# Patient Record
Sex: Male | Born: 1960 | Race: Black or African American | Hispanic: No | Marital: Single | Smoking: Current every day smoker
Health system: Southern US, Community
[De-identification: ages and names within clinical notes are randomized; demographics above are authoritative.]

## PROBLEM LIST (undated history)

## (undated) DIAGNOSIS — J449 Chronic obstructive pulmonary disease, unspecified: Secondary | ICD-10-CM

## (undated) DIAGNOSIS — R918 Other nonspecific abnormal finding of lung field: Secondary | ICD-10-CM

## (undated) DIAGNOSIS — R0600 Dyspnea, unspecified: Secondary | ICD-10-CM

## (undated) DIAGNOSIS — I10 Essential (primary) hypertension: Secondary | ICD-10-CM

## (undated) HISTORY — PX: KNEE SURGERY: SHX244

## (undated) HISTORY — DX: Other nonspecific abnormal finding of lung field: R91.8

---

## 2006-12-09 ENCOUNTER — Emergency Department: Payer: Self-pay | Admitting: Emergency Medicine

## 2018-03-19 ENCOUNTER — Other Ambulatory Visit: Payer: Self-pay

## 2018-03-19 ENCOUNTER — Emergency Department
Admission: EM | Admit: 2018-03-19 | Discharge: 2018-03-19 | Disposition: A | Discharge status: Home / Self Care | Payer: Self-pay | Attending: Emergency Medicine | Admitting: Emergency Medicine

## 2018-03-19 DIAGNOSIS — F17200 Nicotine dependence, unspecified, uncomplicated: Secondary | ICD-10-CM | POA: Insufficient documentation

## 2018-03-19 DIAGNOSIS — J020 Streptococcal pharyngitis: Secondary | ICD-10-CM | POA: Insufficient documentation

## 2018-03-19 LAB — GROUP A STREP BY PCR: Group A Strep by PCR: DETECTED — AB

## 2018-03-19 MED ORDER — ACETAMINOPHEN 325 MG PO TABS
650.0000 mg | ORAL_TABLET | Freq: Once | ORAL | Status: AC | PRN
  Administered 2018-03-19: 650 mg via ORAL

## 2018-03-19 MED ORDER — ACETAMINOPHEN 325 MG PO TABS
ORAL_TABLET | ORAL | Status: AC
  Filled 2018-03-19: qty 2

## 2018-03-19 MED ORDER — AMOXICILLIN-POT CLAVULANATE 875-125 MG PO TABS
1.0000 | ORAL_TABLET | Freq: Once | ORAL | Status: AC
  Administered 2018-03-19: 1 via ORAL
  Filled 2018-03-19: qty 1

## 2018-03-19 MED ORDER — PREDNISONE 20 MG PO TABS
60.0000 mg | ORAL_TABLET | Freq: Once | ORAL | Status: AC
  Administered 2018-03-19: 60 mg via ORAL
  Filled 2018-03-19: qty 3

## 2018-03-19 MED ORDER — ACETAMINOPHEN 500 MG PO TABS
1000.0000 mg | ORAL_TABLET | Freq: Once | ORAL | Status: AC
  Administered 2018-03-19: 1000 mg via ORAL
  Filled 2018-03-19: qty 2

## 2018-03-19 MED ORDER — AMOXICILLIN-POT CLAVULANATE 875-125 MG PO TABS: 1.0000 | ORAL_TABLET | Freq: Two times a day (BID) | ORAL | 0 refills | Status: AC

## 2018-03-19 MED ORDER — PREDNISONE 10 MG PO TABS: 10.0000 mg | ORAL_TABLET | Freq: Every day | ORAL | 0 refills | Status: DC

## 2018-03-19 NOTE — ED Notes (Signed)
See triage note  Presents with fever and sore throat  States sx's started couple of days ago  Febrile on arrival

## 2018-03-19 NOTE — ED Provider Notes (Signed)
[facility] EMERGENCY DEPARTMENT Provider Note   CSN: [id number] Arrival date & time: 03/19/18  1129    History   Chief Complaint Chief Complaint  Patient presents with  . Sore Throat    HPI Connor Kline is a 58 y.o. male.  Presents to the emergency department for evaluation of 2 days of sore throat.  He denies any cough congestion or runny nose.  He states he has had some chills and a fever.  He has not take any medications for his symptoms.  He is tolerating p.o. well.  No difficulty swallowing.  No known contacts with strep.     HPI  History reviewed. No pertinent past medical history.  There are no active problems to display for this patient.   Past Surgical History:  Procedure Laterality Date  . KNEE SURGERY          Home Medications    Prior to Admission medications   Medication Sig Start Date End Date Taking? Authorizing Provider  amoxicillin-clavulanate (AUGMENTIN) 875-125 MG tablet Take 1 tablet by mouth every 12 (twelve) hours for 10 days. 03/19/18 03/29/18  Duanne Guess, PA-C  predniSONE (DELTASONE) 10 MG tablet Take 1 tablet (10 mg total) by mouth daily. 6,5,4,3,2,1 six day taper 03/19/18   Renata Caprice    Family History History reviewed. No pertinent family history.  Social History Social History   Tobacco Use  . Smoking status: Current Every Day Smoker  Substance Use Topics  . Alcohol use: Yes  . Drug use: Not on file     Allergies   Patient has no known allergies.   Review of Systems Review of Systems  Constitutional: Positive for fever.  HENT: Positive for sore throat. Negative for congestion, rhinorrhea, sinus pressure and sinus pain.   Respiratory: Negative for cough, wheezing and stridor.   Gastrointestinal: Negative for diarrhea, nausea and vomiting.  Musculoskeletal: Positive for myalgias. Negative for arthralgias and neck stiffness.  Skin: Negative for rash.  Neurological: Negative for  dizziness.     Physical Exam Updated Vital Signs BP 133/89   Pulse 76   Temp (!) 101.6 F (38.7 C) (Oral)   Resp 18   Ht 5\' 11"  (1.803 m)   Wt 81.6 kg   SpO2 97%   BMI 25.10 kg/m   Physical Exam Constitutional:      General: He is not in acute distress.    Appearance: He is well-developed.  HENT:     Head: Normocephalic and atraumatic.     Jaw: No trismus.     Right Ear: Hearing and external ear normal.     Left Ear: Hearing and external ear normal.     Nose: Rhinorrhea present.     Right Sinus: No maxillary sinus tenderness or frontal sinus tenderness.     Left Sinus: No maxillary sinus tenderness or frontal sinus tenderness.     Mouth/Throat:     Pharynx: Uvula midline. Oropharyngeal exudate and posterior oropharyngeal erythema present. No pharyngeal swelling or uvula swelling.     Tonsils: Tonsillar exudate present. No tonsillar abscesses. Swelling: 1+ on the right. 1+ on the left.  Eyes:     Conjunctiva/sclera: Conjunctivae normal.  Neck:     Musculoskeletal: Normal range of motion.  Cardiovascular:     Rate and Rhythm: Normal rate and regular rhythm.  Pulmonary:     Effort: No respiratory distress.     Breath sounds: No stridor. No wheezing.  Chest:  Chest wall: No tenderness.  Abdominal:     General: There is no distension.     Palpations: Abdomen is soft.     Tenderness: There is no abdominal tenderness. There is no guarding.  Musculoskeletal: Normal range of motion.  Skin:    General: Skin is warm and dry.     Findings: No rash.  Neurological:     Mental Status: He is alert and oriented to person, place, and time.  Psychiatric:        Behavior: Behavior normal.        Thought Content: Thought content normal.        Judgment: Judgment normal.      ED Treatments / Results  Labs (all labs ordered are listed, but only abnormal results are displayed) Labs Reviewed  GROUP A STREP BY PCR - Abnormal; Notable for the following components:       Result Value   Group A Strep by PCR DETECTED (*)    All other components within normal limits    EKG None  Radiology No results found.  Procedures Procedures (including critical care time)  Medications Ordered in ED Medications  acetaminophen (TYLENOL) tablet 650 mg (650 mg Oral Given 03/19/18 1150)  amoxicillin-clavulanate (AUGMENTIN) 875-125 MG per tablet 1 tablet (1 tablet Oral Given 03/19/18 1240)  acetaminophen (TYLENOL) tablet 1,000 mg (1,000 mg Oral Given 03/19/18 1240)  predniSONE (DELTASONE) tablet 60 mg (60 mg Oral Given 03/19/18 1257)     Initial Impression / Assessment and Plan / ED Course  I have reviewed the triage vital signs and the nursing notes.  Pertinent labs & imaging results that were available during my care of the patient were reviewed by me and considered in my medical decision making (see chart for details).        *58 year old male with exudative pharyngitis with no signs of peritonsillar abscess.  He tolerates p.o. well.  He is given Tylenol for fever.  He was able to tolerate p.o. antibiotics.  He is discharged home with prednisone, Augmentin.  He will alternate Tylenol and ibuprofen as needed.  He understands signs symptoms return to ED for.  Final Clinical Impressions(s) / ED Diagnoses   Final diagnoses:  Strep pharyngitis    ED Discharge Orders         Ordered    amoxicillin-clavulanate (AUGMENTIN) 875-125 MG tablet  Every 12 hours     03/19/18 1339    predniSONE (DELTASONE) 10 MG tablet  Daily     03/19/18 1339           Renata Caprice 03/19/18 1342    Lavonia Drafts, MD 03/19/18 1421

## 2018-03-19 NOTE — ED Triage Notes (Signed)
C/o sore throat that began last night. No facial swelling noted. A&O, ambulatory.

## 2018-03-19 NOTE — Discharge Instructions (Addendum)
Please make sure you are drinking lots of fluids.  Alternate Tylenol and ibuprofen as needed for pain or fevers.  Return to the ER immediately for any difficulty swallowing, fevers above 102, worsening symptoms or urgent changes in your health.

## 2020-03-24 ENCOUNTER — Emergency Department
Admission: EM | Admit: 2020-03-24 | Discharge: 2020-03-24 | Disposition: A | Discharge status: Home / Self Care | Payer: Self-pay | Attending: Emergency Medicine | Admitting: Emergency Medicine

## 2020-03-24 ENCOUNTER — Emergency Department: Payer: Self-pay

## 2020-03-24 ENCOUNTER — Encounter: Payer: Self-pay | Admitting: Radiology

## 2020-03-24 ENCOUNTER — Other Ambulatory Visit: Payer: Self-pay

## 2020-03-24 DIAGNOSIS — R200 Anesthesia of skin: Secondary | ICD-10-CM | POA: Insufficient documentation

## 2020-03-24 DIAGNOSIS — Z20822 Contact with and (suspected) exposure to covid-19: Secondary | ICD-10-CM | POA: Insufficient documentation

## 2020-03-24 DIAGNOSIS — R918 Other nonspecific abnormal finding of lung field: Secondary | ICD-10-CM

## 2020-03-24 DIAGNOSIS — I10 Essential (primary) hypertension: Secondary | ICD-10-CM

## 2020-03-24 DIAGNOSIS — I200 Unstable angina: Secondary | ICD-10-CM

## 2020-03-24 DIAGNOSIS — F17200 Nicotine dependence, unspecified, uncomplicated: Secondary | ICD-10-CM | POA: Insufficient documentation

## 2020-03-24 DIAGNOSIS — F1290 Cannabis use, unspecified, uncomplicated: Secondary | ICD-10-CM

## 2020-03-24 DIAGNOSIS — R079 Chest pain, unspecified: Secondary | ICD-10-CM

## 2020-03-24 DIAGNOSIS — F1490 Cocaine use, unspecified, uncomplicated: Secondary | ICD-10-CM

## 2020-03-24 DIAGNOSIS — Z720 Tobacco use: Secondary | ICD-10-CM

## 2020-03-24 LAB — CBC
HCT: 46.1 % (ref 39.0–52.0)
Hemoglobin: 15.2 g/dL (ref 13.0–17.0)
MCH: 27.9 pg (ref 26.0–34.0)
MCHC: 33 g/dL (ref 30.0–36.0)
MCV: 84.6 fL (ref 80.0–100.0)
Platelets: 282 10*3/uL (ref 150–400)
RBC: 5.45 MIL/uL (ref 4.22–5.81)
RDW: 13.8 % (ref 11.5–15.5)
WBC: 8.8 10*3/uL (ref 4.0–10.5)
nRBC: 0 % (ref 0.0–0.2)

## 2020-03-24 LAB — BASIC METABOLIC PANEL
Anion gap: 7 (ref 5–15)
BUN: 15 mg/dL (ref 6–20)
CO2: 23 mmol/L (ref 22–32)
Calcium: 9.7 mg/dL (ref 8.9–10.3)
Chloride: 106 mmol/L (ref 98–111)
Creatinine, Ser: 0.86 mg/dL (ref 0.61–1.24)
GFR, Estimated: >60 mL/min (ref >60)
Glucose, Bld: 100 mg/dL — ABNORMAL HIGH (ref 70–99)
Potassium: 3.9 mmol/L (ref 3.5–5.1)
Sodium: 136 mmol/L (ref 135–145)

## 2020-03-24 LAB — RESP PANEL BY RT-PCR (FLU A&B, COVID) ARPGX2
Influenza A by PCR: NEGATIVE
Influenza B by PCR: NEGATIVE
SARS Coronavirus 2 by RT PCR: NEGATIVE

## 2020-03-24 LAB — TROPONIN I (HIGH SENSITIVITY)
Troponin I (High Sensitivity): 5 ng/L (ref <18)
Troponin I (High Sensitivity): 5 ng/L (ref <18)

## 2020-03-24 MED ORDER — IOHEXOL 350 MG/ML SOLN
100.0000 mL | Freq: Once | INTRAVENOUS | Status: AC | PRN
  Administered 2020-03-24: 100 mL via INTRAVENOUS

## 2020-03-24 NOTE — ED Notes (Signed)
ED Provider at bedside. 

## 2020-03-24 NOTE — ED Provider Notes (Signed)
Emergency department signout note  Care of this patient was signed out to me at the end of the previous value shift.  All pertinent patient information was conveyed and all questions were answered.  Patient was pending a CTA of the chest to evaluate for pulmonary nodule seen on chest x-ray.  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  Given patient's history of tobacco abuse, this is highly likely.  Patient was explained these results and the need for likely MRIs to evaluate for spinal metastasis.  However, patient requested to have these imaging studies done in the outpatient setting as he wishes to go home and get some sleep today.  Patient's vital signs stable.  Patient is not hypoxic.  Patient has no pain or focal neurologic symptoms at this time.  Patient will be given referral to the oncology department in order to follow-up for these studies.  The patient has been reexamined and is ready to be discharged.  All diagnostic results have been reviewed and discussed with the patient/family.  Care plan has been outlined and the patient/family understands all current diagnoses, results, and treatment plans.  There are no new complaints, changes, or physical findings at this time.  All questions have been addressed and answered.  Patient was instructed to, and agrees to follow-up with their primary care physician as well as return to the emergency department if any new or worsening symptoms develop.   Naaman Plummer, MD 03/24/20 1005

## 2020-03-24 NOTE — ED Notes (Signed)
Dr. Cheri Fowler at bedside for reevaluation.

## 2020-03-24 NOTE — ED Triage Notes (Addendum)
Pt states that he started to have left arm numbness yesterday when he reached for a cigarette and today he started to have numbness to both legs and arms along with chest pain after eating. Pt states the numbness comes and goes, and says right now it is getting better.

## 2020-03-24 NOTE — ED Notes (Signed)
Pt returned from CT at this time.  

## 2020-03-24 NOTE — ED Notes (Signed)
ER provider made aware of pt.

## 2020-03-24 NOTE — ED Provider Notes (Signed)
[facility] Emergency Department Provider Note  ____________________________________________   Event Date/Time   First MD Initiated Contact with Patient 03/24/20 [phone]     (approximate)  I have reviewed the triage vital signs and the nursing notes.   HISTORY  Chief Complaint Chest Pain and Numbness    HPI Connor Kline is a 60 y.o. male with no known medical problems although he never goes to the doctor.  He presents for evaluation of chest pain.  He said that a couple days ago he started to have left arm numbness all of a sudden when he reached for a cigarette offered by her friend.  It went away within a few minutes.  However today he started to have central sharp chest pain with heaviness that radiates through to his back and the numbness.  It seemed to happen after eating.  Nothing in particular made it better or worse.  It is better now and does not seem to be related to exertion.  He has no weakness in his extremities.  He is unaware of having high blood pressure and does not take medicines.  He admits to tobacco use and marijuana use.  He says he smokes cocaine sometimes but has not done so for at least a week.  The symptoms were moderate in severity and are now gone but they come back sometimes.         History reviewed. No pertinent past medical history.  There are no problems to display for this patient.   Past Surgical History:  Procedure Laterality Date  . KNEE SURGERY      Prior to Admission medications   Medication Sig Start Date End Date Taking? Authorizing Provider  predniSONE (DELTASONE) 10 MG tablet Take 1 tablet (10 mg total) by mouth daily. 6,5,4,3,2,1 six day taper 03/19/18   Duanne Guess, PA-C    Allergies Patient has no known allergies.  No family history on file.  Social History Social History   Tobacco Use  . Smoking status: Current Every Day Smoker  Substance Use Topics  . Alcohol use: Yes    Review of  Systems Constitutional: No fever/chills Eyes: No visual changes. ENT: No sore throat. Cardiovascular: Chest pain as described above. Respiratory: Denies shortness of breath. Gastrointestinal: No abdominal pain.  No nausea, no vomiting.  No diarrhea.  No constipation. Genitourinary: Negative for dysuria. Musculoskeletal: Negative for neck pain.  Negative for back pain. Integumentary: Negative for rash. Neurological: Some numbness in his arms, particularly the left arm.  Occasional numbness in the back of his legs but that has resolved.  No focal weakness.   ____________________________________________   PHYSICAL EXAM:  VITAL SIGNS: ED Triage Vitals  Enc Vitals Group     BP 03/24/20 0236 (!) 174/119     Pulse Rate 03/24/20 0236 76     Resp 03/24/20 0236 20     Temp 03/24/20 0236 98.6 F (37 C)     Temp src --      SpO2 03/24/20 0236 98 %     Weight 03/24/20 0238 81.6 kg (180 lb)     Height 03/24/20 0238 1.803 m (5\' 11" )     Head Circumference --      Peak Flow --      Pain Score 03/24/20 0238 3     Pain Loc --      Pain Edu? --      Excl. in [place]? --     Constitutional: Alert and  oriented.  Eyes: Conjunctivae are normal.  Head: Atraumatic. Nose: No congestion/rhinnorhea. Mouth/Throat: Patient is wearing a mask. Neck: No stridor.  No meningeal signs.   Cardiovascular: Normal rate, regular rhythm. Good peripheral circulation. Respiratory: Normal respiratory effort.  No retractions. Gastrointestinal: Soft and nontender. No distention.  Musculoskeletal: No lower extremity tenderness nor edema. No gross deformities of extremities. Neurologic:  Normal speech and language. No gross focal neurologic deficits are appreciated.  Skin:  Skin is warm, dry and intact. Psychiatric: Mood and affect are normal. Speech and behavior are normal.  ____________________________________________   LABS (all labs ordered are listed, but only abnormal results are displayed)  Labs Reviewed   BASIC METABOLIC PANEL - Abnormal; Notable for the following components:      Result Value   Glucose, Bld 100 (*)    All other components within normal limits  RESP PANEL BY RT-PCR (FLU A&B, COVID) ARPGX2  CBC  TROPONIN I (HIGH SENSITIVITY)  TROPONIN I (HIGH SENSITIVITY)   ____________________________________________  EKG  ED ECG REPORT I, Hinda Kehr, the attending physician, personally viewed and interpreted this ECG.  Date: 03/24/2020 EKG Time: 2:23 AM Rate: 74 Rhythm: normal sinus rhythm QRS Axis: normal Intervals: normal ST/T Wave abnormalities: There is some mild ST segment elevation in leads V2 and V3 which is suspected chronic with no reciprocal changes. Narrative Interpretation: no definitive evidence of acute ischemia  ____________________________________________  RADIOLOGY I, Hinda Kehr, personally viewed and evaluated these images (plain radiographs) as part of my medical decision making, as well as reviewing the written report by the radiologist.  ED MD interpretation: Chest x-ray shows bilateral pulmonary nodules largest 3 cm in the left upper lobe.  CT chest/abdomen/pelvis pending at the time of transfer of care to Dr. Cheri Fowler.  Official radiology report(s): DG Chest 2 View  Result Date: 03/24/2020 CLINICAL DATA:  Chest pain EXAM: CHEST - 2 VIEW COMPARISON:  None. FINDINGS: The heart size and mediastinal contours are within normal limits. There is a 3 cm nodular opacity seen within the left upper lung. Within the right lung there is a 7 mm nodular opacity within the right upper lobe. No pleural effusion or pneumothorax is noted. No acute osseous abnormality. IMPRESSION: Bilateral pulmonary nodules, the largest measuring 3 cm within the left upper lobe. Would recommend dedicated chest CT with contrast for further evaluation. Electronically Signed   By: Prudencio Pair M.D.   On: 03/24/2020 03:06     ____________________________________________   PROCEDURES   Procedure(s) performed (including Critical Care):  .1-3 Lead EKG Interpretation Performed by: Hinda Kehr, MD Authorized by: Hinda Kehr, MD     Interpretation: normal     ECG rate:  70   ECG rate assessment: normal     Rhythm: sinus rhythm     Ectopy: none     Conduction: normal       ____________________________________________   INITIAL IMPRESSION / MDM / ASSESSMENT AND PLAN / ED COURSE  As part of my medical decision making, I reviewed the following data within the [facility] notes reviewed and incorporated, Labs reviewed , EKG interpreted , Old chart reviewed, Radiograph reviewed  and Notes from prior ED visits   Differential diagnosis includes, but is not limited to, ACS, AAS, uncontrolled hypertension, musculoskeletal pain, angina.  The patient is on the cardiac monitor to evaluate for evidence of arrhythmia and/or significant heart rate changes.  The patient does not typically have chest pain and has multiple risk factors including a family  member (father) who had a heart attack, the patient's own tobacco and cocaine use, and most likely diagnoses that at least include hypertension even though he has not been to the doctor recently.  Technically this suggests unstable angina given the nature of his symptoms and the fact that it is occurring at rest when he does not normally have pain.  His EKG does not meet criteria for STEMI but could represent acute ischemia even though it most likely represents more of a chronic issue.  He is not currently having chest pain but his blood pressure is quite elevated.  Lab work is notable for a normal CBC, and initial high-sensitivity troponin of 5 with a second 1 pending, and a basic metabolic panel that is normal.  He has no abdominal pain or tenderness.  The patient has a pending chest x-ray.     Clinical Course as of 03/24/20 0818  Mon  Mar 24, 2020  0635 I personally reviewed the patient's imaging and agree with the radiologist's interpretation that the patient has multiple large pulmonary nodules on chest x-ray.  The radiologist specifically recommended that I obtain a CT chest with IV contrast.  However I am concerned about the fact that the patient is having chest pain with numbness in his upper extremities associated with very high blood pressure.  In an attempt to evaluate for AAS as well as pulmonary nodules, will obtain a CTA chest/abdomen/pelvis dissection protocol in the hopes that this will also adequately imaged the lung parenchyma.  I believe that the patient is high enough risk for ACS that if his CTA is negative for AAS, he should be started on heparin and given a full dose aspirin and admitted for further management of his blood pressure and chest pain. [CF]  [id number] Transferred ED care to Dr. Cheri Fowler to follow-up on CTA and to disposition the patient appropriately. [CF]    Clinical Course User Index [CF] Hinda Kehr, MD     ____________________________________________  FINAL CLINICAL IMPRESSION(S) / ED DIAGNOSES  Final diagnoses:  Unstable angina (HCC)  Chest pain, unspecified type  Uncontrolled hypertension  Tobacco use  Cocaine use  Marijuana use     MEDICATIONS GIVEN DURING THIS VISIT:  Medications  iohexol (OMNIPAQUE) 350 MG/ML injection 100 mL (100 mLs Intravenous Contrast Given 03/24/20 0656)     ED Discharge Orders    None      *Please note:  DOC MANDALA was evaluated in Emergency Department on 03/24/2020 for the symptoms described in the history of present illness. He was evaluated in the context of the global COVID-19 pandemic, which necessitated consideration that the patient might be at risk for infection with the SARS-CoV-2 virus that causes COVID-19. Institutional protocols and algorithms that pertain to the evaluation of patients at risk for COVID-19 are in a state of rapid  change based on information released by regulatory bodies including the CDC and federal and state organizations. These policies and algorithms were followed during the patient's care in the ED.  Some ED evaluations and interventions may be delayed as a result of limited staffing during and after the pandemic.*  Note:  This document was prepared using Dragon voice recognition software and may include unintentional dictation errors.   Hinda Kehr, MD 03/24/20 [phone]

## 2020-03-24 NOTE — ED Notes (Signed)
Pt presents via triage for c/o chest pain since last night. Pt reports that 2 days ago his L arm went numb and then got better. Pt sts then after eating a chicken sandwich he had chest pain and the backs of his arms and legs went numb. Pt sts he has slight numbness still. Chest pain resolved without intervention. Pt also reports SOB and nausea with chest pain.

## 2020-03-31 ENCOUNTER — Encounter: Payer: Self-pay | Admitting: Hematology and Oncology

## 2020-03-31 NOTE — [place] Notes (Signed)
[facility]  [address] [place], [place] [address] Phone: [phone]  Fax: [phone]   Clinic Day:  04/01/2020  Referring physician: Naaman Plummer, MD  Chief Complaint: Connor Kline is a 60 y.o. male with a RUL lung mass who is referred in consultation by Dr. Valora Piccolo for assessment and management.   HPI: The patient presented to the [facility] ER on 03/24/2020 for evaluation of chest pain. A couple of days prior, he had sudden onset arm and leg numbness which went away within a few minutes. He then started to have shortness of breath and central sharp chest pain/heaviness that radiated to his back. The pain occurred after he ate a sandwich. Pain resolved by the time he went to the ER.  CXR revealed bilateral pulmonary nodules, the largest measuring 3 cm within the left upper lobe.   Chest, abdomen, and pelvis CT angiogram revealed severe emphysema with a 2.7 cm right upper lobe lung nodule/mass most c/w bronchogenic carcinoma.  There was no lymphadenopathy or distant metastatic disease. Recommended referral to [facility]. There was aortic atherosclerosis and tortuosity. There was no aortic aneurysm or dissection. There was iliofemoral atherosclerosis. Major arterial structures remained patent. No other acute or inflammatory process was identified in the chest, abdomen, or pelvis.  He does not have a PCP. He does not have any other medical problems. He had surgery on his right knee. He does not take any medications. He has never had a colonoscopy.  Symptomatically, he reports sweats, shortness of breath, and weight loss (unknown amount). He has lower back pain.  He denies fevers, headaches, changes in vision, runny nose, sore throat, cough, chest pain, palpitations, nausea, vomiting, diarrhea, reflux, urinary symptoms, skin changes, numbness, weakness, balance or coordination problem, and bleeding of any kind.  His  mother has "cancer in her blood". She is still living. He denies a family history of blood disorders.  He denies any family history of malignancy.   History reviewed. No pertinent past medical history.  Past Surgical History:  Procedure Laterality Date  . KNEE SURGERY      History reviewed. No pertinent family history.  Social History:  reports that he has been smoking. He has never used smokeless tobacco. He reports current alcohol use. No history on file for drug use. The patient smokes a pack of cigarettes per day. He has smoked a pack per day since he was in his teens. He used to drink a lot of alcohol. Now, he rarely drinks. He smokes marijuana. He has used cocaine. He denies exposure to radiation or toxins. His sister-in-law is Patent examiner. He lives in [place]. 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 The patient is accompanied by Anne Ng today.  Allergies: No Known Allergies  Current Medications: Current Outpatient Medications  Medication Sig Dispense Refill  . predniSONE (DELTASONE) 10 MG tablet Take 1 tablet (10 mg total) by mouth daily. 6,5,4,3,2,1 six day taper (Patient not taking: No sig reported) 21 tablet 0   No current facility-administered medications for this visit.    Review of Systems  Constitutional: Positive for diaphoresis and weight loss. Negative for chills, fever and malaise/fatigue.  HENT: Negative for congestion, ear discharge, ear pain, hearing loss, nosebleeds, sinus pain, sore throat and tinnitus.   Eyes: Negative for blurred vision and double vision.  Respiratory: Positive for shortness of breath. Negative for cough, hemoptysis and sputum production.   Cardiovascular:  Negative for chest pain, palpitations and leg swelling.  Gastrointestinal: Negative for abdominal pain, blood in stool, constipation, diarrhea, heartburn, melena, nausea and vomiting.  Genitourinary: Negative for dysuria, frequency, hematuria and urgency.   Musculoskeletal: Positive for back pain (lumbar spine). Negative for joint pain, myalgias and neck pain.  Skin: Negative for itching and rash.  Neurological: Negative for dizziness, tingling, sensory change, weakness and headaches.  Endo/Heme/Allergies: Does not bruise/bleed easily.  Psychiatric/Behavioral: Negative for depression and memory loss. The patient is not nervous/anxious and does not have insomnia.   All other systems reviewed and are negative.  Performance status (ECOG): 1  Vitals Blood pressure (!) 151/104, pulse 71, temperature (!) 96.6 F (35.9 C), temperature source Tympanic, weight 200 lb 1.1 oz (90.8 kg), SpO2 97 %.   Physical Exam Vitals and nursing note reviewed.  Constitutional:      General: He is not in acute distress.    Appearance: He is not diaphoretic.  HENT:     Head: Normocephalic and atraumatic.     Mouth/Throat:     Mouth: Mucous membranes are moist.     Pharynx: Oropharynx is clear.  Eyes:     General: No scleral icterus.    Extraocular Movements: Extraocular movements intact.     Conjunctiva/sclera: Conjunctivae normal.     Pupils: Pupils are equal, round, and reactive to light.  Cardiovascular:     Rate and Rhythm: Normal rate and regular rhythm.     Heart sounds: Normal heart sounds. No murmur heard.   Pulmonary:     Effort: Pulmonary effort is normal. No respiratory distress.     Breath sounds: Normal breath sounds. No wheezing or rales.  Chest:     Chest wall: No tenderness.  Breasts:     Right: No axillary adenopathy or supraclavicular adenopathy.     Left: No axillary adenopathy or supraclavicular adenopathy.    Abdominal:     General: Bowel sounds are normal. There is no distension.     Palpations: Abdomen is soft. There is no mass.     Tenderness: There is no abdominal tenderness. There is no guarding or rebound.  Musculoskeletal:        General: No swelling or tenderness. Normal range of motion.     Cervical back: Normal  range of motion and neck supple.  Lymphadenopathy:     Head:     Right side of head: No preauricular, posterior auricular or occipital adenopathy.     Left side of head: No preauricular, posterior auricular or occipital adenopathy.     Cervical: No cervical adenopathy.     Upper Body:     Right upper body: No supraclavicular or axillary adenopathy.     Left upper body: No supraclavicular or axillary adenopathy.     Lower Body: No right inguinal adenopathy. No left inguinal adenopathy.  Skin:    General: Skin is warm and dry.  Neurological:     Mental Status: He is alert and oriented to person, place, and time.  Psychiatric:        Behavior: Behavior normal.        Thought Content: Thought content normal.        Judgment: Judgment normal.    No visits with results within 3 Day(s) from this visit.  Latest known visit with results is:  Admission on 03/24/2020, Discharged on 03/24/2020  Component Date Value Ref Range Status  . Sodium 03/24/2020 136  135 - 145 mmol/L Final  . Potassium 03/24/2020  3.9  3.5 - 5.1 mmol/L Final  . Chloride 03/24/2020 106  98 - 111 mmol/L Final  . CO2 03/24/2020 23  22 - 32 mmol/L Final  . Glucose, Bld 03/24/2020 100* 70 - 99 mg/dL Final   Glucose reference range applies only to samples taken after fasting for at least 8 hours.  . BUN 03/24/2020 15  6 - 20 mg/dL Final  . Creatinine, Ser 03/24/2020 0.86  0.61 - 1.24 mg/dL Final  . Calcium 03/24/2020 9.7  8.9 - 10.3 mg/dL Final  . GFR, Estimated 03/24/2020 >60  >60 mL/min Final   Comment: (NOTE) Calculated using the CKD-EPI Creatinine Equation (2021)   . Anion gap 03/24/2020 7  5 - 15 Final   Performed at [facility], [address] [place], [place] [address]  . WBC 03/24/2020 8.8  4.0 - 10.5 K/uL Final  . RBC 03/24/2020 5.45  4.22 - 5.81 MIL/uL Final  . Hemoglobin 03/24/2020 15.2  13.0 - 17.0 g/dL Final  . HCT 03/24/2020 46.1  39.0 - 52.0 % Final  . MCV 03/24/2020 84.6  80.0 - 100.0 fL  Final  . MCH 03/24/2020 27.9  26.0 - 34.0 pg Final  . MCHC 03/24/2020 33.0  30.0 - 36.0 g/dL Final  . RDW 03/24/2020 13.8  11.5 - 15.5 % Final  . Platelets 03/24/2020 282  150 - 400 K/uL Final  . nRBC 03/24/2020 0.0  0.0 - 0.2 % Final   Performed at [facility], [address] [place], [place] [address]  . Troponin I (High Sensitivity) 03/24/2020 5  <18 ng/L Final   Comment: (NOTE) Elevated high sensitivity troponin I (hsTnI) values and significant  changes across serial measurements may suggest ACS but many other  chronic and acute conditions are known to elevate hsTnI results.  Refer to the "Links" section for chest pain algorithms and additional  guidance. Performed at [facility], [address] [place], [place] [address]   . Troponin I (High Sensitivity) 03/24/2020 5  <18 ng/L Final   Comment: (NOTE) Elevated high sensitivity troponin I (hsTnI) values and significant  changes across serial measurements may suggest ACS but many other  chronic and acute conditions are known to elevate hsTnI results.  Refer to the "Links" section for chest pain algorithms and additional  guidance. Performed at [facility], [address], [place], [place] [address]   . SARS Coronavirus 2 by RT PCR 03/24/2020 NEGATIVE  NEGATIVE Final   Comment: (NOTE) SARS-CoV-2 target nucleic acids are NOT DETECTED.  The SARS-CoV-2 RNA is generally detectable in upper respiratory specimens during the acute phase of infection. The lowest concentration of SARS-CoV-2 viral copies this assay can detect is 138 copies/mL. A negative result does not preclude SARS-Cov-2 infection and should not be used as the sole basis for treatment or other patient management decisions. A negative result may occur with  improper specimen collection/handling, submission of specimen other than nasopharyngeal swab, presence of viral mutation(s) within the areas targeted by this assay, and inadequate  number of viral copies(<138 copies/mL). A negative result must be combined with clinical observations, patient history, and epidemiological information. The expected result is Negative.  Fact Sheet for Patients:  EntrepreneurPulse.com.au  Fact Sheet for Healthcare Providers:  IncredibleEmployment.be  This test is no                          t yet approved or cleared by the [place] FDA  and  has been authorized for detection and/or diagnosis of SARS-CoV-2 by FDA under an Emergency Use Authorization (EUA). This EUA will remain  in effect (meaning this test can be used) for the duration of the COVID-19 declaration under Section 564(b)(1) of the Act, 21 U.S.C.section 360bbb-3(b)(1), unless the authorization is terminated  or revoked sooner.      . Influenza A by PCR 03/24/2020 NEGATIVE  NEGATIVE Final  . Influenza B by PCR 03/24/2020 NEGATIVE  NEGATIVE Final   Comment: (NOTE) The Xpert Xpress SARS-CoV-2/FLU/RSV plus assay is intended as an aid in the diagnosis of influenza from Nasopharyngeal swab specimens and should not be used as a sole basis for treatment. Nasal washings and aspirates are unacceptable for Xpert Xpress SARS-CoV-2/FLU/RSV testing.  Fact Sheet for Patients: EntrepreneurPulse.com.au  Fact Sheet for Healthcare Providers: IncredibleEmployment.be  This test is not yet approved or cleared by the [place] FDA and has been authorized for detection and/or diagnosis of SARS-CoV-2 by FDA under an Emergency Use Authorization (EUA). This EUA will remain in effect (meaning this test can be used) for the duration of the COVID-19 declaration under Section 564(b)(1) of the Act, 21 U.S.C. section 360bbb-3(b)(1), unless the authorization is terminated or revoked.  Performed at [facility], [address] [place], [place] [address]     Assessment:  Connor Kline is a 60 y.o.  male with a right upper lobe mass.  Chest, abdomen, and pelvis CT angiogram revealed severe emphysema with a 2.7 cm right upper lobe lung nodule/mass most c/w bronchogenic carcinoma.  There was no lymphadenopathy or distant metastatic disease. Recommended referral to [facility]. There was aortic atherosclerosis and tortuosity. There was no aortic aneurysm or dissection. There was iliofemoral atherosclerosis. Major arterial structures remained patent. No other acute or inflammatory process was identified in the chest, abdomen, or pelvis.  Symptomatically, hereports sweats, shortness of breath, and weight loss (unknown amount). He has lower back pain.  Exam is unremarkable.  Plan: 1.   Labs today:  CBC with diff, CMP.  2.   Right upper lobe lung mass  Review CT angiogram with patient.  He appears to have clinical T1cN0M0 right upper lobe lung cancer.  Discuss plan for PET scan to ensure no evidence of metastatic disease.  Discuss staging and treatment of lung cancer.   If he has stage I disease, he may be a candidate for resection.    Obtain PFTs.   If he has evidence of mediastinal involvement or metastatic disease, discuss biopsy.    Discuss indications for chemotherapy and/or radiation.  Discuss the plan for head imaging (head MRI).  Multiple questions asked and answered.   2.   Transportation issues  Patient interested in [place] services. 3.   PET scan on 04/08/2020. 4.   Schedule PFTs. 5.   Present at tumor board on 04/10/2020. 6.   RTC after above for MD assessment and discussion regarding direction of therapy.  I discussed the assessment and treatment plan with the patient.  The patient was provided an opportunity to ask questions and all were answered.  The patient agreed with the plan and demonstrated an understanding of the instructions.  The patient was advised to call back if the symptoms worsen or if the condition fails to improve as  anticipated.  I provided 28 minutes of face-to-face time during this this encounter and > 50% was spent counseling as documented under my assessment and plan.  An additional 15 minutes were spent  reviewing his chart (Epic and Care Everywhere) including notes, labs, and imaging studies.    Tyrese Capriotti C. Mike Gip, MD, PhD    04/01/2020, 3:08 PM  I, Mirian Mo Tufford, am acting as Education administrator for [employer]. Mike Gip, MD, PhD.  I, Moya Duan C. Mike Gip, MD, have reviewed the above documentation for accuracy and completeness, and I agree with the above.

## 2020-04-01 ENCOUNTER — Encounter: Payer: Self-pay | Admitting: Hematology and Oncology

## 2020-04-01 ENCOUNTER — Inpatient Hospital Stay: Payer: Self-pay | Attending: Hematology and Oncology | Admitting: Hematology and Oncology

## 2020-04-01 ENCOUNTER — Other Ambulatory Visit: Payer: Self-pay

## 2020-04-01 ENCOUNTER — Inpatient Hospital Stay: Payer: Self-pay

## 2020-04-01 VITALS — BP 151/104 | HR 71 | Temp 96.6°F | Wt 200.1 lb

## 2020-04-01 DIAGNOSIS — R918 Other nonspecific abnormal finding of lung field: Secondary | ICD-10-CM

## 2020-04-01 DIAGNOSIS — C3411 Malignant neoplasm of upper lobe, right bronchus or lung: Secondary | ICD-10-CM | POA: Insufficient documentation

## 2020-04-01 LAB — CBC WITH DIFFERENTIAL/PLATELET
Abs Immature Granulocytes: 0.04 10*3/uL (ref 0.00–0.07)
Basophils Absolute: 0.1 10*3/uL (ref 0.0–0.1)
Basophils Relative: 1 %
Eosinophils Absolute: 0.2 10*3/uL (ref 0.0–0.5)
Eosinophils Relative: 2 %
HCT: 45.8 % (ref 39.0–52.0)
Hemoglobin: 14.4 g/dL (ref 13.0–17.0)
Immature Granulocytes: 1 %
Lymphocytes Relative: 31 %
Lymphs Abs: 2.7 10*3/uL (ref 0.7–4.0)
MCH: 27 pg (ref 26.0–34.0)
MCHC: 31.4 g/dL (ref 30.0–36.0)
MCV: 85.9 fL (ref 80.0–100.0)
Monocytes Absolute: 0.5 10*3/uL (ref 0.1–1.0)
Monocytes Relative: 5 %
Neutro Abs: 5.4 10*3/uL (ref 1.7–7.7)
Neutrophils Relative %: 60 %
Platelets: 293 10*3/uL (ref 150–400)
RBC: 5.33 MIL/uL (ref 4.22–5.81)
RDW: 14 % (ref 11.5–15.5)
WBC: 8.8 10*3/uL (ref 4.0–10.5)
nRBC: 0 % (ref 0.0–0.2)

## 2020-04-01 LAB — COMPREHENSIVE METABOLIC PANEL
ALT: 19 U/L (ref 0–44)
AST: 20 U/L (ref 15–41)
Albumin: 4 g/dL (ref 3.5–5.0)
Alkaline Phosphatase: 34 U/L — ABNORMAL LOW (ref 38–126)
Anion gap: 11 (ref 5–15)
BUN: 17 mg/dL (ref 6–20)
CO2: 26 mmol/L (ref 22–32)
Calcium: 9.7 mg/dL (ref 8.9–10.3)
Chloride: 104 mmol/L (ref 98–111)
Creatinine, Ser: 0.98 mg/dL (ref 0.61–1.24)
GFR, Estimated: >60 mL/min (ref >60)
Glucose, Bld: 100 mg/dL — ABNORMAL HIGH (ref 70–99)
Potassium: 4.6 mmol/L (ref 3.5–5.1)
Sodium: 141 mmol/L (ref 135–145)
Total Bilirubin: 0.5 mg/dL (ref 0.3–1.2)
Total Protein: 8.1 g/dL (ref 6.5–8.1)

## 2020-04-10 ENCOUNTER — Other Ambulatory Visit: Payer: Self-pay

## 2020-04-10 NOTE — [place] Notes (Signed)
Tumor Board Documentation  Connor Kline was presented by Dr Mike Gip at our Tumor Board on 04/10/2020, which included representatives from medical oncology,radiation oncology,surgical,radiology,pathology,navigation,research,palliative care,genetics,pharmacy,pulmonology.  Connor Kline currently presents as a current patient,for discussion with history of the following treatments: active survellience.  Additionally, we reviewed previous medical and familial history, history of present illness, and recent lab results along with all available histopathologic and imaging studies. The tumor board considered available treatment options and made the following recommendations: Additional screening,Surgery,Biopsy (PET Scan, PFT's)    The following procedures/referrals were also placed: No orders of the defined types were placed in this encounter.   Clinical Trial Status: not discussed   Staging used: To be determined  AJCC Staging:       Group: Lung Mass RUL   National site-specific guidelines   were discussed with respect to the case.  Tumor board is a meeting of clinicians from various specialty areas who evaluate and discuss patients for whom a multidisciplinary approach is being considered. Final determinations in the plan of care are those of the provider(s). The responsibility for follow up of recommendations given during tumor board is that of the provider.   Today's extended care, comprehensive team conference, Connor Kline was not present for the discussion and was not examined.   Multidisciplinary Tumor Board is a multidisciplinary case peer review process.  Decisions discussed in the Multidisciplinary Tumor Board reflect the opinions of the specialists present at the conference without having examined the patient.  Ultimately, treatment and diagnostic decisions rest with the primary provider(s) and the patient.

## 2020-04-15 ENCOUNTER — Other Ambulatory Visit: Payer: Self-pay | Admitting: Hematology and Oncology

## 2020-04-15 ENCOUNTER — Other Ambulatory Visit: Payer: Self-pay

## 2020-04-15 ENCOUNTER — Encounter
Admission: RE | Admit: 2020-04-15 | Discharge: 2020-04-15 | Disposition: A | Discharge status: Home / Self Care | Payer: Self-pay | Source: Ambulatory Visit | Attending: Hematology and Oncology | Admitting: Hematology and Oncology

## 2020-04-15 DIAGNOSIS — R918 Other nonspecific abnormal finding of lung field: Secondary | ICD-10-CM

## 2020-04-15 LAB — GLUCOSE, CAPILLARY: Glucose-Capillary: 90 mg/dL (ref 70–99)

## 2020-04-15 MED ORDER — FLUDEOXYGLUCOSE F - 18 (FDG) INJECTION
10.4000 | Freq: Once | INTRAVENOUS | Status: AC | PRN
  Administered 2020-04-15: 10.5 via INTRAVENOUS

## 2020-04-15 NOTE — [place] Notes (Signed)
[facility]  [address], [address] [place], [place] [address] Phone: [phone]  Fax: [phone]   Clinic Day:  04/16/2020  Referring physician: Naaman Plummer, MD  Chief Complaint: Connor Kline is a 60 y.o. male with a RUL lung mass who is seen for 2 week assessment and discussion regarding direction of therapy.  HPI: The patient was last seen in the medical oncology clinic on 04/01/2020 for new patient assessment. At that time, he deccribed sweats, shortness of breath, and weight loss (unknown amount). He had lower back pain.  Exam was unremarkable. Hematocrit was 45.8, hemoglobin 14.4, platelets 293,000, WBC 8,800 (ANC 5400).  CMP was normal.  Tumor Board on 04/10/2020 recommended additional screening, surgery, biopsy (PET Scan, PFTs).  PET scan on 04/15/2020 revealed a 2.6 cm hypermetabolic left upper lobe lung mass c/w primary lung neoplasm. There were surrounding interstitial changes which could reflect obstructive pneumonitis or interstitial spread of tumor. There were no enlarged or hypermetabolic mediastinal or hilar nodes to suggest metastatic adenopathy. There was no evidence of distant metastatic disease. There were emphysematous changes and age advanced atherosclerotic disease involving the thoracic and abdominal aorta, iliac arteries and coronary arteries.  During the interim, he has been good. He reports shortness of breath on exertion. He has not had any sweats lately. His lower back hurts when he is active.  The patient is not taking BP medication. He does not have a PCP. He does not have health insurance right now.   Past Medical History:  Diagnosis Date  . Lung mass     Past Surgical History:  Procedure Laterality Date  . KNEE SURGERY      Family History  Problem Relation Age of Onset  . Cancer Mother   . Stroke Father   . Heart attack Father     Social History:  reports that he has been smoking. He has never used  smokeless tobacco. He reports current alcohol use. No history on file for drug use. The patient smokes a pack of cigarettes per day. He has smoked a pack per day since he was in his teens. He used to drink a lot of alcohol. Now, he rarely drinks. He smokes marijuana. He has used cocaine. He denies exposure to radiation or toxins. His sister-in-law is Patent examiner. He lives in [place]. 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 The patient is accompanied by Anne Ng today.  Allergies: No Known Allergies  Current Medications: No current outpatient medications on file.   No current facility-administered medications for this visit.    Review of Systems  Constitutional: Positive for weight loss (1 lb). Negative for chills, diaphoresis, fever and malaise/fatigue.  HENT: Negative for congestion, ear discharge, ear pain, hearing loss, nosebleeds, sinus pain, sore throat and tinnitus.   Eyes: Negative for blurred vision and double vision.  Respiratory: Positive for shortness of breath (on exertion). Negative for cough, hemoptysis and sputum production.   Cardiovascular: Negative for chest pain, palpitations and leg swelling.  Gastrointestinal: Negative for abdominal pain, blood in stool, constipation, diarrhea, heartburn, melena, nausea and vomiting.  Genitourinary: Negative for dysuria, frequency, hematuria and urgency.  Musculoskeletal: Positive for back pain (lumbar spine). Negative for joint pain, myalgias and neck pain.  Skin: Negative for itching and rash.  Neurological: Negative for dizziness, tingling, sensory change, weakness and headaches.  Endo/Heme/Allergies: Does not bruise/bleed easily.  Psychiatric/Behavioral: Negative for depression and memory loss. The patient is not  nervous/anxious and does not have insomnia.   All other systems reviewed and are negative.  Performance status (ECOG): 1  Vitals Blood pressure (!) 145/100, pulse 64, temperature (!) 97.2  F (36.2 C), temperature source Tympanic, resp. rate 16, weight 199 lb 15.3 oz (90.7 kg), SpO2 97 %.   Physical Exam Vitals and nursing note reviewed.  Constitutional:      General: He is not in acute distress.    Appearance: He is not diaphoretic.  Eyes:     General: No scleral icterus.    Conjunctiva/sclera: Conjunctivae normal.  Neurological:     Mental Status: He is alert and oriented to person, place, and time.  Psychiatric:        Behavior: Behavior normal.        Thought Content: Thought content normal.        Judgment: Judgment normal.    Hospital Outpatient Visit on 04/15/2020  Component Date Value Ref Range Status  . Glucose-Capillary 04/15/2020 90  70 - 99 mg/dL Final   Glucose reference range applies only to samples taken after fasting for at least 8 hours.    Assessment:  Connor Kline is a 60 y.o. male with a right upper lobe mass.  Chest, abdomen, and pelvis CT angiogram revealed severe emphysema with a 2.7 cm right upper lobe lung nodule/mass most c/w bronchogenic carcinoma.  There was no lymphadenopathy or distant metastatic disease. Recommended referral to [facility]. There was aortic atherosclerosis and tortuosity. There was no aortic aneurysm or dissection. There was iliofemoral atherosclerosis. Major arterial structures remained patent. No other acute or inflammatory process was identified in the chest, abdomen, or pelvis.  PET scan on 04/15/2020 revealed a 2.6 cm hypermetabolic left upper lobe lung mass c/w primary lung neoplasm. There were surrounding interstitial changes which could reflect obstructive pneumonitis or interstitial spread of tumor. There were no enlarged or hypermetabolic mediastinal or hilar nodes to suggest metastatic adenopathy. There was no evidence of distant metastatic disease. There were emphysematous changes and age advanced atherosclerotic disease involving the thoracic and abdominal aorta, iliac arteries  and coronary arteries.  Symptomatically, he has shortness of breath on exertion.  Plan: 1.   Review labs from 04/01/2020. 2.   Right upper lobe lung mass  PET scan on 04/15/2020 was personally reviewed.  Agree with radiology findings.   He has an isolated 2.6 cm LUL mass with no apparent mediastinal disease.  He has clinical T1cN0M0 right upper lobe lung cancer (stage IA3).  Discuss plan for biopsy.  I have spoken with Dr Patsey Berthold.    He is scheduled for pulmonary consult on 04/18/2020 at 4 pm.   Anticipate bronchoscopy with endobronchial navigation.  Follow-up PFTs.  Discuss plan to schedule head MRI after pathology confirmed. 3.   Present at tumor board on 04/24/2020. 4.   RN: assist with obtaining a PCP. 5.   RTC 3 days after biopsy for MD assessment and discussion regarding direction of therapy.  I discussed the assessment and treatment plan with the patient.  The patient was provided an opportunity to ask questions and all were answered.  The patient agreed with the plan and demonstrated an understanding of the instructions.  The patient was advised to call back if the symptoms worsen or if the condition fails to improve as anticipated.  I provided 10 minutes of face-to-face time during this this encounter and > 50% was spent counseling as documented under my assessment and plan.  An additional 10+  minutes were spent reviewing his chart (Epic and Care Everywhere) including notes, labs, and imaging studies and contacting Dr Patsey Berthold.    Jaspreet Hollings C. Mike Gip, MD, PhD    04/16/2020, 4:15 PM  I, Mirian Mo Tufford, am acting as Education administrator for [employer]. Mike Gip, MD, PhD.  I, Constantinos Krempasky C. Mike Gip, MD, have reviewed the above documentation for accuracy and completeness, and I agree with the above.

## 2020-04-16 ENCOUNTER — Inpatient Hospital Stay: Payer: Self-pay

## 2020-04-16 ENCOUNTER — Encounter: Payer: Self-pay | Admitting: Hematology and Oncology

## 2020-04-16 ENCOUNTER — Inpatient Hospital Stay (HOSPITAL_BASED_OUTPATIENT_CLINIC_OR_DEPARTMENT_OTHER): Payer: Self-pay | Admitting: Hematology and Oncology

## 2020-04-16 ENCOUNTER — Telehealth: Payer: Self-pay | Admitting: Pulmonary Disease

## 2020-04-16 ENCOUNTER — Other Ambulatory Visit: Payer: Self-pay

## 2020-04-16 VITALS — BP 145/100 | HR 64 | Temp 97.2°F | Resp 16 | Wt 200.0 lb

## 2020-04-16 DIAGNOSIS — R918 Other nonspecific abnormal finding of lung field: Secondary | ICD-10-CM

## 2020-04-16 NOTE — Telephone Encounter (Signed)
Schedule him for 04/18/2020 at 4:00 please.

## 2020-04-16 NOTE — [place] Notes (Signed)
Pt b/p up and never dx with HTN . Manual b/p 140/90. Pt has some back pain on left side at lower back only when picks things up. He knows he has lung mass but does not bother him

## 2020-04-16 NOTE — Telephone Encounter (Signed)
Patient has been scheduled for 04/18/2020 at 4:00. Nothing further needed at this time.

## 2020-04-18 ENCOUNTER — Other Ambulatory Visit: Payer: Self-pay

## 2020-04-18 ENCOUNTER — Encounter: Payer: Self-pay | Admitting: Pulmonary Disease

## 2020-04-18 ENCOUNTER — Telehealth: Payer: Self-pay | Admitting: Pulmonary Disease

## 2020-04-18 ENCOUNTER — Ambulatory Visit (INDEPENDENT_AMBULATORY_CARE_PROVIDER_SITE_OTHER): Payer: Self-pay | Admitting: Pulmonary Disease

## 2020-04-18 VITALS — BP 162/98 | HR 93 | Temp 98.2°F | Ht 71.0 in | Wt 198.6 lb

## 2020-04-18 DIAGNOSIS — I10 Essential (primary) hypertension: Secondary | ICD-10-CM

## 2020-04-18 DIAGNOSIS — R918 Other nonspecific abnormal finding of lung field: Secondary | ICD-10-CM

## 2020-04-18 DIAGNOSIS — F17210 Nicotine dependence, cigarettes, uncomplicated: Secondary | ICD-10-CM

## 2020-04-18 DIAGNOSIS — J449 Chronic obstructive pulmonary disease, unspecified: Secondary | ICD-10-CM

## 2020-04-18 MED ORDER — BISOPROLOL FUMARATE 5 MG PO TABS: 5.0000 mg | ORAL_TABLET | Freq: Every day | ORAL | 2 refills | Status: AC

## 2020-04-18 NOTE — Telephone Encounter (Signed)
Bronchoscopy with navigation and cellvizio is scheduled for 04/28/2020 at 12:45. Dx:RUL lung nodule KGO:[id number]  Rodena Piety, please see bronch info. Thanks

## 2020-04-18 NOTE — H&P (View-Only) (Signed)
Subjective:    Patient ID: Connor Kline, male    DOB: October 23, 1960, 60 y.o.   MRN: [id number]  HPI Patient is a 60 year old current smoker (45-pack-year history) who presents for evaluation of a LEFT upper lobe mass.  Patient is kindly referred by Dr. Nolon Stalls.  He has no primary care physician.  He is being referred to get primary care.  He presents today with no complaint with regards to the LEFT upper lobe mass.  The mass was incidentally found on CT scan of the chest performed on 28 February for evaluation of chest pain during a visit to the [facility] ER.  Initial chest x-ray revealed bilateral pulmonary nodules with the largest being 3 cm within the LEFT upper lobe.  A chest CT performed for the purposes of ruling out PE showed the mass with associated severe emphysema.  There was no lymphadenopathy noted.  No evidence of metastatic disease on the chest CT.  Since that evaluation in the ED the patient has noted no chest pain, no cough over his usual morning cough.  No hemoptysis.  He has not had any sputum production except for occasional whitish to grayish sputum.  No wheezing or shortness of breath.  He has had weight loss (he is unsure how much) but no anorexia.  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  He voices no other complaint.  Review of Systems A 10 point review of systems was performed and it is as noted above otherwise negative.  Past Medical History:  Diagnosis Date  . Lung mass    Patient Active Problem List   Diagnosis Date Noted  . Mass of upper lobe of left lung 04/21/2020  . Hypertension 04/21/2020   Past Surgical History:  Procedure Laterality Date  . KNEE SURGERY     Family History  Problem Relation Age of Onset  . Cancer Mother   . Stroke Father   . Heart attack Father    Social History   Tobacco Use  . Smoking status: Current Every Day Smoker    Packs/day: 1.00    Years: 45.00    Pack years: 45.00    Types:  Cigarettes  . Smokeless tobacco: Never Used  . Tobacco comment: about 3 a day 4 days out of the week.  Substance Use Topics  . Alcohol use: Not Currently   Patient has worked as a Production manager and fixing cars.  Does smoke marijuana.  Used cocaine in the past but not currently.  He is single, has no children.  Supportive brother and sister-in-law.  Resents today with his sister-in-law, Connor Kline.  No Known Allergies  Currently on no meds  Immunization History  Administered Date(s) Administered  . PFIZER Comirnaty(Gray Top)Covid-19 Tri-Sucrose Vaccine 02/22/2020, 03/14/2020      Objective:   Physical Exam BP (!) 162/98 (BP Location: Left Arm, Patient Position: Sitting, Cuff Size: Normal)   Pulse 93   Temp 98.2 F (36.8 C) (Temporal)   Ht 5\' 11"  (1.803 m)   Wt 198 lb 9.6 oz (90.1 kg)   SpO2 100%   BMI 27.70 kg/m  GENERAL: Well-developed well-nourished gentleman in no acute distress. HEAD: Normocephalic, atraumatic.  EYES: Pupils equal, round, reactive to light.  No scleral icterus.  MOUTH: Nose/mouth/throat not examined due to masking requirements for COVID 19. NECK: Supple. No thyromegaly. Trachea midline. No JVD.  No adenopathy. PULMONARY: Good air entry bilaterally.  No adventitious sounds. CARDIOVASCULAR: S1  and S2. Regular rate and rhythm.  No rubs, murmurs or gallops heard. ABDOMEN: Benign. MUSCULOSKELETAL: No joint deformity, no clubbing, no edema.  NEUROLOGIC: No focal deficit, no gait disturbance, speech is fluent. SKIN: Intact,warm,dry.  On limited exam no rashes PSYCH: Mood and behavior normal.   Representative images from 24 March 2020 studies:      Representative image from 15 April 2020 PET/CT:      Assessment & Plan:     ICD-10-CM   1. Mass of upper lobe of left lung  R91.8    Carcinoma until proven otherwise Will need diagnosis by ENB Procedure scheduled for 4 April  2. Hypertension, unspecified type  I10    Needs better control blood  pressure Initiate bisoprolol 5 mg daily Needs primary care  3. COPD suggested by initial evaluation ([place])  J44.9    Currently asymptomatic Does have emphysema noted on chest CT  4. Tobacco dependence due to cigarettes  F17.210    Patient counseled regards discontinuation of smoking Total counseling time 3 to 5 minutes   Meds ordered this encounter  Medications  . bisoprolol (ZEBETA) 5 MG tablet    Sig: Take 1 tablet (5 mg total) by mouth daily.    Dispense:  30 tablet    Refill:  2   Discussion:  Patient has a LEFT upper lobe mass that is carcinoma until proven otherwise.  This is intensely FDG avid with an SUV max of 11.35.  There is a smaller nodule on the right that does not have any FDG avidity.  It is 7 mm, pleural-based and appears to be related to scarring.  He will need bronchoscopy for evaluation of the LEFT upper lobe mass.  Would benefit from bronchoscopy with navigational assistance.  Procedure has been scheduled for 4 April at 12:45 PM.  Patient has hypertension.  He needs to see a primary care physician.  He has however poorly controlled we will start bisoprolol 1 tablet daily (he assures me he is no longer using cocaine).  We will see the patient in follow-up in 4 to 6 weeks time he is to call sooner should any new difficulties arise.  Renold Don, MD [facility]   *This note was dictated using voice recognition software/Dragon.  Despite best efforts to proofread, errors can occur which can change the meaning.  Any change was purely unintentional.

## 2020-04-18 NOTE — Progress Notes (Signed)
Subjective:    Patient ID: Connor Kline, male    DOB: 1960-08-27, 60 y.o.   MRN: [id number]  HPI Patient is a 60 year old current smoker (45-pack-year history) who presents for evaluation of a LEFT upper lobe mass.  Patient is kindly referred by Dr. Nolon Stalls.  He has no primary care physician.  He is being referred to get primary care.  He presents today with no complaint with regards to the LEFT upper lobe mass.  The mass was incidentally found on CT scan of the chest performed on 28 February for evaluation of chest pain during a visit to the [facility] ER.  Initial chest x-ray revealed bilateral pulmonary nodules with the largest being 3 cm within the LEFT upper lobe.  A chest CT performed for the purposes of ruling out PE showed the mass with associated severe emphysema.  There was no lymphadenopathy noted.  No evidence of metastatic disease on the chest CT.  Since that evaluation in the ED the patient has noted no chest pain, no cough over his usual morning cough.  No hemoptysis.  He has not had any sputum production except for occasional whitish to grayish sputum.  No wheezing or shortness of breath.  He has had weight loss (he is unsure how much) but no anorexia.  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  He voices no other complaint.  Review of Systems A 10 point review of systems was performed and it is as noted above otherwise negative.  Past Medical History:  Diagnosis Date  . Lung mass    Patient Active Problem List   Diagnosis Date Noted  . Mass of upper lobe of left lung 04/21/2020  . Hypertension 04/21/2020   Past Surgical History:  Procedure Laterality Date  . KNEE SURGERY     Family History  Problem Relation Age of Onset  . Cancer Mother   . Stroke Father   . Heart attack Father    Social History   Tobacco Use  . Smoking status: Current Every Day Smoker    Packs/day: 1.00    Years: 45.00    Pack years: 45.00    Types:  Cigarettes  . Smokeless tobacco: Never Used  . Tobacco comment: about 3 a day 4 days out of the week.  Substance Use Topics  . Alcohol use: Not Currently   Patient has worked as a Production manager and fixing cars.  Does smoke marijuana.  Used cocaine in the past but not currently.  He is single, has no children.  Supportive brother and sister-in-law.  Resents today with his sister-in-law, Anne Ng.  No Known Allergies  Currently on no meds  Immunization History  Administered Date(s) Administered  . PFIZER Comirnaty(Gray Top)Covid-19 Tri-Sucrose Vaccine 02/22/2020, 03/14/2020      Objective:   Physical Exam BP (!) 162/98 (BP Location: Left Arm, Patient Position: Sitting, Cuff Size: Normal)   Pulse 93   Temp 98.2 F (36.8 C) (Temporal)   Ht 5\' 11"  (1.803 m)   Wt 198 lb 9.6 oz (90.1 kg)   SpO2 100%   BMI 27.70 kg/m  GENERAL: Well-developed well-nourished gentleman in no acute distress. HEAD: Normocephalic, atraumatic.  EYES: Pupils equal, round, reactive to light.  No scleral icterus.  MOUTH: Nose/mouth/throat not examined due to masking requirements for COVID 19. NECK: Supple. No thyromegaly. Trachea midline. No JVD.  No adenopathy. PULMONARY: Good air entry bilaterally.  No adventitious sounds. CARDIOVASCULAR: S1  and S2. Regular rate and rhythm.  No rubs, murmurs or gallops heard. ABDOMEN: Benign. MUSCULOSKELETAL: No joint deformity, no clubbing, no edema.  NEUROLOGIC: No focal deficit, no gait disturbance, speech is fluent. SKIN: Intact,warm,dry.  On limited exam no rashes PSYCH: Mood and behavior normal.   Representative images from 24 March 2020 studies:      Representative image from 15 April 2020 PET/CT:      Assessment & Plan:     ICD-10-CM   1. Mass of upper lobe of left lung  R91.8    Carcinoma until proven otherwise Will need diagnosis by ENB Procedure scheduled for 4 April  2. Hypertension, unspecified type  I10    Needs better control blood  pressure Initiate bisoprolol 5 mg daily Needs primary care  3. COPD suggested by initial evaluation ([place])  J44.9    Currently asymptomatic Does have emphysema noted on chest CT  4. Tobacco dependence due to cigarettes  F17.210    Patient counseled regards discontinuation of smoking Total counseling time 3 to 5 minutes   Meds ordered this encounter  Medications  . bisoprolol (ZEBETA) 5 MG tablet    Sig: Take 1 tablet (5 mg total) by mouth daily.    Dispense:  30 tablet    Refill:  2   Discussion:  Patient has a LEFT upper lobe mass that is carcinoma until proven otherwise.  This is intensely FDG avid with an SUV max of 11.35.  There is a smaller nodule on the right that does not have any FDG avidity.  It is 7 mm, pleural-based and appears to be related to scarring.  He will need bronchoscopy for evaluation of the LEFT upper lobe mass.  Would benefit from bronchoscopy with navigational assistance.  Procedure has been scheduled for 4 April at 12:45 PM.  Patient has hypertension.  He needs to see a primary care physician.  He has however poorly controlled we will start bisoprolol 1 tablet daily (he assures me he is no longer using cocaine).  We will see the patient in follow-up in 4 to 6 weeks time he is to call sooner should any new difficulties arise.  Renold Don, MD [place] [facility]   *This note was dictated using voice recognition software/Dragon.  Despite best efforts to proofread, errors can occur which can change the meaning.  Any change was purely unintentional.

## 2020-04-18 NOTE — Patient Instructions (Addendum)
I have started you on a blood pressure medicine is going to be 1 tablet daily.  The prescription was sent to the [facility].  We are getting you scheduled for the biopsy of the lesion that you have in your chest.  This is been scheduled for 4 April at 12:45 PM you will have to be in the hospital 1 hour before.  You will need to have someone to bring you.  They will call you with more instructions.   We will see you in follow-up in 4 to 6 weeks time, call sooner should any new problems arise.

## 2020-04-21 ENCOUNTER — Encounter: Payer: Self-pay | Admitting: Pulmonary Disease

## 2020-04-21 DIAGNOSIS — R918 Other nonspecific abnormal finding of lung field: Secondary | ICD-10-CM | POA: Insufficient documentation

## 2020-04-21 DIAGNOSIS — I10 Essential (primary) hypertension: Secondary | ICD-10-CM | POA: Insufficient documentation

## 2020-04-21 NOTE — Telephone Encounter (Signed)
Spoke with [employer]

## 2020-04-21 NOTE — Telephone Encounter (Signed)
Phone pre admit visit 04/23/2020 between 1-5 and covid test 04/24/2020 at 11:15.  Patient is aware of dates/times. He voiced his understanding and had no further questions.  Nothing further is needed.

## 2020-04-23 ENCOUNTER — Other Ambulatory Visit: Payer: Self-pay

## 2020-04-23 ENCOUNTER — Other Ambulatory Visit
Admission: RE | Admit: 2020-04-23 | Discharge: 2020-04-23 | Disposition: A | Discharge status: Home / Self Care | Payer: Self-pay | Source: Ambulatory Visit | Attending: Pulmonary Disease | Admitting: Pulmonary Disease

## 2020-04-23 HISTORY — DX: Essential (primary) hypertension: I10

## 2020-04-23 HISTORY — DX: Chronic obstructive pulmonary disease, unspecified: J44.9

## 2020-04-23 HISTORY — DX: Dyspnea, unspecified: R06.00

## 2020-04-23 NOTE — Patient Instructions (Signed)
Your procedure is scheduled on: Monday April 28, 2020. Report to [facility] Surgery inside [facility]nd floor (stop by Admissions desk first before getting on Elevator). To find out your arrival time please call [phone] between 1PM - 3PM on Friday April 25, 2020.  Remember: Instructions that are not followed completely may result in serious medical risk,  up to and including death, or upon the discretion of your surgeon and anesthesiologist your  surgery may need to be rescheduled.     _X__ 1. Do not eat food or drink fluids after midnight the night before your procedure.                 No chewing gum or hard candies.   __X__2.  On the morning of surgery brush your teeth with toothpaste and water, you                may rinse your mouth with mouthwash if you wish.  Do not swallow any toothpaste of mouthwash.     _X__ 3.  No Alcohol for 24 hours before or after surgery.   _X__ 4.  Do Not Smoke or use e-cigarettes For 24 Hours Prior to Your Surgery.                 Do not use any chewable tobacco products for at least 6 hours prior to                 Surgery.  _X__  5.  Do not use any recreational drugs (marijuana, cocaine, heroin, ecstasy, MDMA or other)                For at least one week prior to your surgery.  Combination of these drugs with anesthesia                May have life threatening results.  __X__ 6.  Notify your doctor if there is any change in your medical condition      (cold, fever, infections).     Do not wear jewelry, make-up, hairpins, clips or nail polish. Do not wear lotions, powders, or perfumes. You may wear deodorant. Do not shave 48 hours prior to surgery. Men may shave face and neck. Do not bring valuables to the hospital.    [facility] is not responsible for any belongings or valuables.  Contacts, dentures or bridgework may not be worn into surgery. Leave your suitcase in the car. After surgery it may be brought to your  room. For patients admitted to the hospital, discharge time is determined by your treatment team.   Patients discharged the day of surgery will not be allowed to drive home.   Make arrangements for someone to be with you for the first 24 hours of your Same Day Discharge.   __X__ Take these medicines the morning of surgery with A SIP OF WATER:    1. bisoprolol (ZEBETA) 5 MG  2.   3.   4.  5.  6.  ____ Fleet Enema (as directed)   __X__ Use CHG Soap (or wipes) as directed  ____ Use Benzoyl Peroxide Gel as instructed  ____ Use inhalers on the day of surgery  ____ Stop metformin 2 days prior to surgery    ____ Take 1/2 of usual insulin dose the night before surgery. No insulin the morning          of surgery.   __X__ Stop Anti-inflammatories such as Ibuprofen, Aleve, Advil, naproxen,  aspirin and or BC powders.    __X__ Stop supplements until after surgery.    __X__ Do not start any herbal supplements before your procedure.     If you have any questions regarding your pre-procedure instructions,  Please call Pre-admit Testing at [phone].

## 2020-04-24 ENCOUNTER — Other Ambulatory Visit
Admission: RE | Admit: 2020-04-24 | Discharge: 2020-04-24 | Disposition: A | Discharge status: Home / Self Care | Payer: Self-pay | Source: Ambulatory Visit | Attending: Pulmonary Disease | Admitting: Pulmonary Disease

## 2020-04-24 ENCOUNTER — Other Ambulatory Visit: Payer: Self-pay

## 2020-04-24 DIAGNOSIS — Z20822 Contact with and (suspected) exposure to covid-19: Secondary | ICD-10-CM | POA: Insufficient documentation

## 2020-04-24 DIAGNOSIS — Z01812 Encounter for preprocedural laboratory examination: Secondary | ICD-10-CM | POA: Insufficient documentation

## 2020-04-24 LAB — SARS CORONAVIRUS 2 (TAT 6-24 HRS): SARS Coronavirus 2: NEGATIVE

## 2020-04-24 NOTE — Progress Notes (Signed)
Tumor Board Documentation  Connor Kline was presented by Dr Mike Gip at our Tumor Board on 04/24/2020, which included representatives from medical oncology,radiation oncology,internal medicine,navigation,pathology,radiology,surgical,genetics,research,palliative care,pulmonology.  Connor Kline currently presents as a current patient,for discussion with history of the following treatments: active survellience.  Additionally, we reviewed previous medical and familial history, history of present illness, and recent lab results along with all available histopathologic and imaging studies. The tumor board considered available treatment options and made the following recommendations: Biopsy (Scheduled for [facility] 04/28/20)    The following procedures/referrals were also placed: No orders of the defined types were placed in this encounter.   Clinical Trial Status: not discussed   Staging used: To be determined  AJCC Staging:       Group: RUL Mass   National site-specific guidelines   were discussed with respect to the case.  Tumor board is a meeting of clinicians from various specialty areas who evaluate and discuss patients for whom a multidisciplinary approach is being considered. Final determinations in the plan of care are those of the provider(s). The responsibility for follow up of recommendations given during tumor board is that of the provider.   Today's extended care, comprehensive team conference, Connor Kline was not present for the discussion and was not examined.   Multidisciplinary Tumor Board is a multidisciplinary case peer review process.  Decisions discussed in the Multidisciplinary Tumor Board reflect the opinions of the specialists present at the conference without having examined the patient.  Ultimately, treatment and diagnostic decisions rest with the primary provider(s) and the patient.

## 2020-04-27 MED ORDER — LACTATED RINGERS IV SOLN: INTRAVENOUS | Status: DC

## 2020-04-27 MED ORDER — ORAL CARE MOUTH RINSE: 15.0000 mL | Freq: Once | OROMUCOSAL | Status: AC

## 2020-04-27 MED ORDER — FAMOTIDINE 20 MG PO TABS: 20.0000 mg | ORAL_TABLET | Freq: Once | ORAL | Status: DC

## 2020-04-27 MED ORDER — SODIUM CHLORIDE 0.9 % IV SOLN: Freq: Once | INTRAVENOUS | Status: DC

## 2020-04-27 MED ORDER — CHLORHEXIDINE GLUCONATE 0.12 % MT SOLN
15.0000 mL | Freq: Once | OROMUCOSAL | Status: AC
  Administered 2020-04-28: 15 mL via OROMUCOSAL

## 2020-04-28 ENCOUNTER — Ambulatory Visit
Admission: RE | Admit: 2020-04-28 | Discharge: 2020-04-28 | Disposition: A | Discharge status: Home / Self Care | Payer: Self-pay | Source: Ambulatory Visit | Attending: Pulmonary Disease | Admitting: Pulmonary Disease

## 2020-04-28 ENCOUNTER — Telehealth: Payer: Self-pay

## 2020-04-28 ENCOUNTER — Ambulatory Visit: Payer: Self-pay | Admitting: Anesthesiology

## 2020-04-28 ENCOUNTER — Other Ambulatory Visit: Payer: Self-pay

## 2020-04-28 ENCOUNTER — Encounter: Payer: Self-pay | Admitting: Pulmonary Disease

## 2020-04-28 ENCOUNTER — Encounter
Admission: RE | Disposition: A | Discharge status: Home / Self Care | Payer: Self-pay | Source: Ambulatory Visit | Attending: Pulmonary Disease

## 2020-04-28 DIAGNOSIS — F17210 Nicotine dependence, cigarettes, uncomplicated: Secondary | ICD-10-CM | POA: Insufficient documentation

## 2020-04-28 DIAGNOSIS — R918 Other nonspecific abnormal finding of lung field: Secondary | ICD-10-CM | POA: Insufficient documentation

## 2020-04-28 DIAGNOSIS — Z8249 Family history of ischemic heart disease and other diseases of the circulatory system: Secondary | ICD-10-CM | POA: Insufficient documentation

## 2020-04-28 DIAGNOSIS — Z823 Family history of stroke: Secondary | ICD-10-CM | POA: Insufficient documentation

## 2020-04-28 DIAGNOSIS — F1410 Cocaine abuse, uncomplicated: Secondary | ICD-10-CM | POA: Insufficient documentation

## 2020-04-28 DIAGNOSIS — Z809 Family history of malignant neoplasm, unspecified: Secondary | ICD-10-CM | POA: Insufficient documentation

## 2020-04-28 DIAGNOSIS — Z5309 Procedure and treatment not carried out because of other contraindication: Secondary | ICD-10-CM | POA: Insufficient documentation

## 2020-04-28 LAB — URINE DRUG SCREEN, QUALITATIVE (ARMC ONLY)
Amphetamines, Ur Screen: NOT DETECTED
Barbiturates, Ur Screen: NOT DETECTED
Benzodiazepine, Ur Scrn: NOT DETECTED
Cannabinoid 50 Ng, Ur ~~LOC~~: NOT DETECTED
Cocaine Metabolite,Ur ~~LOC~~: POSITIVE — AB
MDMA (Ecstasy)Ur Screen: NOT DETECTED
Methadone Scn, Ur: NOT DETECTED
Opiate, Ur Screen: NOT DETECTED
Phencyclidine (PCP) Ur S: NOT DETECTED
Tricyclic, Ur Screen: NOT DETECTED

## 2020-04-28 SURGERY — VIDEO BRONCHOSCOPY WITH ENDOBRONCHIAL NAVIGATION
Anesthesia: General | Laterality: Left

## 2020-04-28 MED ORDER — CHLORHEXIDINE GLUCONATE 0.12 % MT SOLN
OROMUCOSAL | Status: AC
  Filled 2020-04-28: qty 15

## 2020-04-28 MED ORDER — MIDAZOLAM HCL 2 MG/2ML IJ SOLN
INTRAMUSCULAR | Status: AC
  Filled 2020-04-28: qty 2

## 2020-04-28 MED ORDER — PROPOFOL 10 MG/ML IV BOLUS
INTRAVENOUS | Status: AC
  Filled 2020-04-28: qty 20

## 2020-04-28 MED ORDER — FENTANYL CITRATE (PF) 100 MCG/2ML IJ SOLN
INTRAMUSCULAR | Status: AC
  Filled 2020-04-28: qty 2

## 2020-04-28 NOTE — Anesthesia Preprocedure Evaluation (Signed)
Anesthesia Evaluation  Patient identified by MRN, date of birth, ID band Patient awake    Reviewed: Allergy & Precautions, H&P , NPO status , Patient's Chart, lab work & pertinent test results  History of Anesthesia Complications Negative for: history of anesthetic complications  Airway Mallampati: III  TM Distance: >3 FB Neck ROM: full    Dental  (+) Missing   Pulmonary shortness of breath and with exertion, COPD, Current Smoker,    Pulmonary exam normal        Cardiovascular Exercise Tolerance: Good hypertension, (-) angina(-) Past MI and (-) DOE Normal cardiovascular exam     Neuro/Psych negative neurological ROS  negative psych ROS   GI/Hepatic negative GI ROS, Neg liver ROS, neg GERD  ,  Endo/Other  negative endocrine ROS  Renal/GU      Musculoskeletal   Abdominal   Peds  Hematology negative hematology ROS (+)   Anesthesia Other Findings Past Medical History: No date: COPD (chronic obstructive pulmonary disease) (HCC) No date: Dyspnea No date: Hypertension No date: Lung mass  Past Surgical History: No date: KNEE SURGERY; Right     Comment:  several years ago as a teenager   BMI    Body Mass Index: 27.75 kg/m      Reproductive/Obstetrics negative OB ROS                             Anesthesia Physical Anesthesia Plan  ASA: III  Anesthesia Plan: General ETT   Post-op Pain Management:    Induction: Intravenous  PONV Risk Score and Plan: Ondansetron, Dexamethasone, Midazolam and Treatment may vary due to age or medical condition  Airway Management Planned: Oral ETT  Additional Equipment:   Intra-op Plan:   Post-operative Plan: Extubation in OR  Informed Consent: I have reviewed the patients History and Physical, chart, labs and discussed the procedure including the risks, benefits and alternatives for the proposed anesthesia with the patient or authorized  representative who has indicated his/her understanding and acceptance.     Dental Advisory Given  Plan Discussed with: Anesthesiologist, CRNA and Surgeon  Anesthesia Plan Comments: (Patient consented for risks of anesthesia including but not limited to:  - adverse reactions to medications - damage to eyes, teeth, lips or other oral mucosa - nerve damage due to positioning  - sore throat or hoarseness - Damage to heart, brain, nerves, lungs, other parts of body or loss of life  Patient voiced understanding.)        Anesthesia Quick Evaluation

## 2020-04-28 NOTE — Telephone Encounter (Signed)
Called patient and informed him of his covid test on 05/06/2020 at 9:45am.

## 2020-04-28 NOTE — Interval H&P Note (Signed)
History and Physical Interval Note:  04/28/2020 12:09 PM  Connor Kline  has presented today for surgery, with the diagnosis of LEFT UPPER LOBE NODULE.  The various methods of treatment have been discussed with the patient and family. After consideration of risks, benefits and other options for treatment, the patient has consented to  Procedure(s): VIDEO BRONCHOSCOPY WITH ENDOBRONCHIAL NAVIGATION (Left) VIDEO BRONCHOSCOPY WITH FLUORO (Left) as a surgical intervention.  The patient's history has been reviewed, patient examined, no change in status, stable for surgery.  I have reviewed the patient's chart and labs.  Questions were answered to the patient's satisfaction.     Renold Don, MD [place] [facility]

## 2020-04-28 NOTE — Progress Notes (Signed)
UDS test back positive for cocaine.  Patient's blood pressure poorly controlled likely due to this.  Patient states that he has been taking "garlic" for BP control and that he used cocaine a few days ago.  Has been advised that he has to stay clean at least 1 to 2 weeks prior to the procedure.  We will reschedule the procedure for 15 April at 1 PM.  Patient was advised that cocaine use is very dangerous in conjunction with anesthetic agents.  Therefore he will have to be rescheduled due to this.  Discussed with anesthesia team.   Renold Don, MD [place] [facility]   *This note was dictated using voice recognition software/Dragon.  Despite best efforts to proofread, errors can occur which can change the meaning.  Any change was purely unintentional.

## 2020-04-28 NOTE — Progress Notes (Signed)
Patients UDS came back positive for cocaine. Patient surgery cancelled today. Dr Patsey Berthold spoke with patient and is working on getting him rescheduled before he leaves.

## 2020-04-28 NOTE — Telephone Encounter (Signed)
Please refer to 04/28/2020 phone note.

## 2020-04-28 NOTE — Telephone Encounter (Signed)
Bronch has been rescheduled to 05/09/2020 at 12:45.

## 2020-04-29 MED ORDER — SEVOFLURANE IN SOLN
RESPIRATORY_TRACT | Status: AC
  Filled 2020-04-29: qty 250

## 2020-04-30 ENCOUNTER — Telehealth: Payer: Self-pay | Admitting: Hematology and Oncology

## 2020-04-30 NOTE — Telephone Encounter (Addendum)
04/30/2020  Spoke w/ pt and relayed message from [employer], and informed him that appt has been made for 4/21 @ 9:30. Pt is agreeable to appt SRW  ----- Message from [facility], RN sent at 04/29/2020  3:11 PM EDT ----- Regarding: Schedule Appt Please schedule pt to follow up with Dr. Mike Gip on Raynelle Dick 4/21 for MD assess and discussion regarding direction of therapy.   Please notify pt with appt details.   [facility]

## 2020-05-01 ENCOUNTER — Telehealth: Payer: Self-pay

## 2020-05-01 NOTE — Telephone Encounter (Signed)
Notified patient via telephone of his appointment with [facility] on 5/11 @ 2pm and to bring proof of income with him to first appt.  Patient states understanding.  No further questions.

## 2020-05-06 ENCOUNTER — Telehealth: Payer: Self-pay | Admitting: Pulmonary Disease

## 2020-05-06 ENCOUNTER — Inpatient Hospital Stay: Admit: 2020-05-06 | Discharge: 2020-05-06 | Disposition: A | Discharge status: Home / Self Care | Payer: Self-pay

## 2020-05-06 NOTE — Telephone Encounter (Signed)
Lm for [facility] pre admit testing.   Spoke to patient, who stated that he can not make it for covid test today. He planning to go to the ED today due to back pain. He will request a covid test during ED visit.  Will leave encounter open to ensure f/u.

## 2020-05-06 NOTE — Progress Notes (Signed)
No show for covid test; call to patient. Patient stated that he has hurt his back and will not be coming today and will be cancelling his surgery as well. Instructed him to call Dr. Patsey Berthold office today to let them know. He stated that he will call them tomorrow. Dr. Patsey Berthold office notified of this conversation with the patient.

## 2020-05-07 ENCOUNTER — Other Ambulatory Visit: Admission: RE | Admit: 2020-05-07 | Payer: Self-pay | Source: Ambulatory Visit

## 2020-05-07 NOTE — Telephone Encounter (Signed)
Per our records, patient did not go to ED.   Lm for patient.

## 2020-05-08 NOTE — Telephone Encounter (Signed)
Spoke to patient, who stated that he has not been able to arrange transportation to get to ED.  He has not been covid tested. He does not think he can arrange transportation today to get covid test.  Patient is aware that bronch will need to be rescheduled.   Dr. Patsey Berthold, please advise on date? Thanks

## 2020-05-08 NOTE — Telephone Encounter (Signed)
Lets try to arrange for the week of the 25th with robotic.

## 2020-05-08 NOTE — Telephone Encounter (Signed)
covid test 05/21/2020 at 9:30.  Patient is aware and voiced his understanding.  Nothing further needed at this time.

## 2020-05-08 NOTE — Telephone Encounter (Signed)
Unable to schedule back to back bronchs on 05/19/2020. Per Dr. Patsey Berthold verbally--schedule for 05/23/2020.  Robotic bronch with navi and cellvizio has been scheduled for 05/23/2020 at 12:15.

## 2020-05-14 ENCOUNTER — Telehealth: Payer: Self-pay | Admitting: Hematology and Oncology

## 2020-05-14 NOTE — Telephone Encounter (Signed)
05/14/2020 Spoke w/ pt and informed him that since his bronchoscopy has been r/s for 4/29, his f/u MD appt was moved out to 5/4 @ 9:45, per Dr.C. Pt confirmed this change SRW

## 2020-05-15 ENCOUNTER — Ambulatory Visit: Payer: Self-pay | Admitting: Hematology and Oncology

## 2020-05-21 ENCOUNTER — Other Ambulatory Visit
Admission: RE | Admit: 2020-05-21 | Discharge: 2020-05-21 | Disposition: A | Discharge status: Home / Self Care | Payer: Self-pay | Source: Ambulatory Visit | Attending: Pulmonary Disease | Admitting: Pulmonary Disease

## 2020-05-21 ENCOUNTER — Telehealth: Payer: Self-pay | Admitting: Pulmonary Disease

## 2020-05-21 NOTE — Progress Notes (Signed)
Scheduled for covid test today, no show at this time. Call to patient, voice mail message left.

## 2020-05-21 NOTE — Telephone Encounter (Signed)
ATC x2.  Called patient's sister in law, Annette(EC) and requested that she contact patient and ask that he contact our office.

## 2020-05-21 NOTE — Telephone Encounter (Signed)
Patient is scheduled for bronch on 05/23/2020. covid test was scheduled for today, however it does not appear that patient had covid test.   Lm for patient to verify.

## 2020-05-22 ENCOUNTER — Other Ambulatory Visit: Admission: RE | Admit: 2020-05-22 | Payer: Self-pay | Source: Ambulatory Visit

## 2020-05-22 NOTE — Telephone Encounter (Addendum)
Spoke to patient's sister in Sports coach, Anne Ng [facility]). Anne Ng stated that she was unable to reach patient, however she will try calling him again.

## 2020-05-22 NOTE — Telephone Encounter (Signed)
Lm for patient and patient's sister in law, Annette(EC).

## 2020-05-22 NOTE — Telephone Encounter (Signed)
Due to non compliance, bronch will need to be canceled. Patient did not show for covid test.  Attempted to contact patient multiple times.  Spoke to [employer] in [place] and canceled case.  Nothing further needed at this time.

## 2020-05-22 NOTE — Telephone Encounter (Signed)
Lm for patient.  

## 2020-05-23 ENCOUNTER — Ambulatory Visit: Admission: RE | Admit: 2020-05-23 | Payer: Self-pay | Source: Home / Self Care | Admitting: Pulmonary Disease

## 2020-05-23 ENCOUNTER — Encounter: Admission: RE | Payer: Self-pay | Source: Home / Self Care

## 2020-05-23 SURGERY — VIDEO BRONCHOSCOPY WITH ENDOBRONCHIAL NAVIGATION
Anesthesia: General | Laterality: Left

## 2020-05-28 ENCOUNTER — Inpatient Hospital Stay: Payer: Self-pay | Attending: Oncology | Admitting: Oncology

## 2020-06-04 ENCOUNTER — Other Ambulatory Visit: Payer: Self-pay

## 2020-06-04 ENCOUNTER — Encounter: Payer: Self-pay | Admitting: Pulmonary Disease

## 2020-06-04 ENCOUNTER — Ambulatory Visit (INDEPENDENT_AMBULATORY_CARE_PROVIDER_SITE_OTHER): Payer: Self-pay | Admitting: Pulmonary Disease

## 2020-06-04 ENCOUNTER — Telehealth: Payer: Self-pay | Admitting: Pulmonary Disease

## 2020-06-04 VITALS — BP 148/78 | HR 65 | Temp 97.7°F | Ht 71.0 in | Wt 199.0 lb

## 2020-06-04 DIAGNOSIS — F1410 Cocaine abuse, uncomplicated: Secondary | ICD-10-CM

## 2020-06-04 DIAGNOSIS — R918 Other nonspecific abnormal finding of lung field: Secondary | ICD-10-CM

## 2020-06-04 NOTE — Telephone Encounter (Signed)
Robotic bronchoscopy with navigation and cellivizo is scheduled for 07/04/2020 at 12:30

## 2020-06-04 NOTE — Telephone Encounter (Signed)
Phone pre admit 06/24/2020 between 8-1 and covid test 07/02/2020 at 8:50.  Patient is aware of above dates/times and voiced his understanding.  Nothing further needed at this time.

## 2020-06-04 NOTE — Patient Instructions (Addendum)
This is very important that you follow instructions closely.  You have a large spot on your left lung that is likely CANCER.  We are trying to get diagnosis of this so that we can treat you appropriately.  We will schedule you for biopsy of this spot on 10 June.  You will need to arrive at 1130 that day to the same day surgery area in the main hospital.  Prior to that you will need a chest CT (special x-ray) so that we can map the way to this area.  You need to not to do any cocaine for at least 1 week prior to the procedure.  If you test positive at the time of the procedure it will have to be CANCELED because this would be too dangerous for your heart with general anesthesia.  You will also need a COVID test prior to the procedure.  This is not a requirement for the chest CT.  We are giving you plenty of time to plan for a ride for the day of the procedure as you cannot drive yourself and you should be accompanied by someone.

## 2020-06-04 NOTE — Progress Notes (Signed)
Subjective:    Patient ID: Connor Kline, male    DOB: 1960-06-03, 60 y.o.   MRN: [id number]  Chief Complaint  Patient presents with   Fatigue    Occ coughs up blood- bright red and the size of a dime.     HPI Patient is a 60 year old current smoker (45-pack-year history) who presents for follow-up of a LEFT upper lobe mass.  Was initially referred by Dr. Nolon Stalls, medical oncologist, no longer with the [facility] system.  The patient was initially evaluated on 18 April 2020 and set up for bronchoscopy on 28 April 2020.  Unfortunately when he presented for that he also had cocaine in his system.  Anesthesia team did not feel comfortable putting the patient under general anesthesia due to use of cocaine up to the time of the proposed procedure.  The procedure was canceled.  The patient was counseled with regards to abstinence from cocaine and was given new procedure date of 23 May 2020.  This time the patient did not follow through with preoperative evaluation with COVID testing or preoperative assessment.  He did not show up for the procedure and this had to be canceled.  He presents today for follow-up this was a previously scheduled appointment.  I have made it very clear to him that he has cancer the only issue now is to try to determine what type.  He notes that he is having some streaky hemoptysis.  He does not endorse any further weight loss.  He does not have anorexia.  He does not endorse shortness of breath.  He voices no other complaint.  The patient understands that he has to abstain from cocaine use so that he can be safely be put under general anesthesia for diagnosis of his lung mass.  Because his CT scan was performed on 28 February he will need repeat CT chest for mapping purposes.  Review of Systems A 10 point review of systems was performed and it is as noted above otherwise negative.  Patient Active Problem List   Diagnosis Date Noted   Cocaine abuse ([place])  04/28/2020   Mass of upper lobe of left lung 04/21/2020   Hypertension 04/21/2020   Social History   Socioeconomic History   Marital status: Single    Spouse name: Not on file   Number of children: Not on file   Years of education: Not on file   Highest education level: Not on file  Occupational History   Not on file  Tobacco Use   Smoking status: Every Day    Packs/day: 0.50    Years: 45.00    Pack years: 22.50    Types: Cigarettes   Smokeless tobacco: Never   Tobacco comments:    about 3 a day 4 days out of the week.  Vaping Use   Vaping Use: Never used  Substance and Sexual Activity   Alcohol use: Yes    Comment: occassionally    Drug use: Yes    Types: Cocaine, Marijuana, "Crack" cocaine    Comment: 1 week ago    Sexual activity: Not on file  Other Topics Concern   Not on file  Social History Narrative   Not on file   No Known Allergies  Current Meds  Medication Sig   Not taking any meds    Immunization History  Administered Date(s) Administered   PFIZER Comirnaty(Gray Top)Covid-19 Tri-Sucrose Vaccine 02/22/2020, 03/14/2020       Objective:   Physical  Exam BP (!) 148/78 (BP Location: Left Arm, Cuff Size: Normal)   Pulse 65   Temp 97.7 F (36.5 C) (Temporal)   Ht 5\' 11"  (1.803 m)   Wt 199 lb (90.3 kg)   SpO2 98%   BMI 27.75 kg/m   GENERAL: Well-developed well-nourished gentleman in no acute distress. HEAD: Normocephalic, atraumatic. EYES: Pupils equal, round, reactive to light.  No scleral icterus. MOUTH: Nose/mouth/throat not examined due to masking requirements for COVID 19. NECK: Supple. No thyromegaly. Trachea midline. No JVD.  No adenopathy. PULMONARY: Good air entry bilaterally.  No adventitious sounds. CARDIOVASCULAR: S1 and S2. Regular rate and rhythm.  No rubs, murmurs or gallops heard. ABDOMEN: Benign. MUSCULOSKELETAL: No joint deformity, no clubbing, no edema. NEUROLOGIC: No focal deficit, no gait disturbance, speech is  fluent. SKIN: Intact,warm,dry.  On limited exam no rashes PSYCH: Mood and behavior normal.     Assessment & Plan:     ICD-10-CM   1. Mass of upper lobe of left lung  R91.8 CT Super D Chest Wo Contrast   He will need repeat CT scan of the chest  [facility] protocol CT ordered Reschedule robotic assisted bronchoscopy 10 June    2. Cocaine abuse ([place])  F14.10    The patient has been advised to abstain from cocaine use He understands dangers of general anesthesia in this setting     Orders Placed This Encounter  Procedures   CT Super D Chest Wo Contrast    Standing Status:   Future    Number of Occurrences:   1    Standing Expiration Date:   06/04/2021    Scheduling Instructions:     [facility] protocol     Arms down     Inspiratory images    Order Specific Question:   Preferred imaging location?    Answer:   [facility]   Benefits, limitations and potential complications of the procedure were discussed with the patient/family  including, but not limited to bleeding, hemoptysis, respiratory failure requiring intubation and/or prolongued mechanical ventilation, infection, pneumothorax (collapse of lung) requiring chest tube placement, stroke or even death.  Patient agrees to proceed.  He understands that he needs to abstain from cocaine use.  Procedure schedule for 10 June.  Renold Don, MD [facility]   *This note was dictated using voice recognition software/Dragon.  Despite best efforts to proofread, errors can occur which can change the meaning.  Any change was purely unintentional.

## 2020-06-24 ENCOUNTER — Encounter
Admission: RE | Admit: 2020-06-24 | Discharge: 2020-06-24 | Disposition: A | Discharge status: Home / Self Care | Payer: Self-pay | Source: Ambulatory Visit | Attending: Pulmonary Disease | Admitting: Pulmonary Disease

## 2020-06-24 ENCOUNTER — Other Ambulatory Visit: Payer: Self-pay

## 2020-06-24 NOTE — Patient Instructions (Signed)
Your procedure is scheduled on: 07/04/20 Report to [place]. To find out your arrival time please call [phone] between 1PM - 3PM on 07/03/20.  Remember: Instructions that are not followed completely may result in serious medical risk, up to and including death, or upon the discretion of your surgeon and anesthesiologist your surgery may need to be rescheduled.     _X__ 1. Do not eat food or drink any liquids after midnight the night before your procedure.                 __X__2.  On the morning of surgery brush your teeth with toothpaste and water, you                 may rinse your mouth with mouthwash if you wish.  Do not swallow any              toothpaste of mouthwash.     _X__ 3.  No Alcohol for 24 hours before or after surgery.   _X__ 4.  Do Not Smoke or use e-cigarettes For 24 Hours Prior to Your Surgery.                 Do not use any chewable tobacco products for at least 6 hours prior to                 surgery.  ____  5.  Bring all medications with you on the day of surgery if instructed.   __X__  6.  Notify your doctor if there is any change in your medical condition      (cold, fever, infections).     Do not wear jewelry, make-up, hairpins, clips or nail polish. Do not wear lotions, powders, or perfumes.  Do not shave 48 hours prior to surgery. Men may shave face and neck. Do not bring valuables to the hospital.    [facility] is not responsible for any belongings or valuables.  Contacts, dentures/partials or body piercings may not be worn into surgery. Bring a case for your contacts, glasses or hearing aids, a denture cup will be supplied. Leave your suitcase in the car. After surgery it may be brought to your room. For patients admitted to the hospital, discharge time is determined by your treatment team.   Patients discharged the day of surgery will not be allowed to drive home.   Please read over the  following fact sheets that you were given:     __X__ Take these medicines the morning of surgery with A SIP OF WATER:    1. none  2.   3.   4.  5.  6.  ____ Fleet Enema (as directed)   ____ Use CHG Soap/SAGE wipes as directed  ____ Use inhalers on the day of surgery  ____ Stop metformin/Janumet/Farxiga 2 days prior to surgery    ____ Take 1/2 of usual insulin dose the night before surgery. No insulin the morning          of surgery.   ____ Stop Blood Thinners Coumadin/Plavix/Xarelto/Pleta/Pradaxa/Eliquis/Effient/Aspirin  on   Or contact your Surgeon, Cardiologist or Medical Doctor regarding  ability to stop your blood thinners  __X__ Stop Anti-inflammatories 7 days before surgery such as Advil, Ibuprofen, Motrin,  BC or Goodies Powder, Naprosyn, Naproxen, Aleve, Aspirin    __X__ Stop all herbal supplements, fish oil or vitamin E until after surgery.  STOP THE GARLIC TABLETS Friday  06/27/20  ____ Bring C-Pap to the hospital.   DO NOT USE ANY DRUGS. YOU WILL BE GIVEN A DRUG TEST THE MORNING OF YOUR PROCEDURE.

## 2020-06-25 ENCOUNTER — Ambulatory Visit
Admission: RE | Admit: 2020-06-25 | Discharge: 2020-06-25 | Disposition: A | Discharge status: Home / Self Care | Payer: Self-pay | Source: Ambulatory Visit | Attending: Pulmonary Disease | Admitting: Pulmonary Disease

## 2020-06-25 DIAGNOSIS — R918 Other nonspecific abnormal finding of lung field: Secondary | ICD-10-CM | POA: Insufficient documentation

## 2020-07-02 ENCOUNTER — Other Ambulatory Visit: Admission: RE | Admit: 2020-07-02 | Payer: Self-pay | Source: Ambulatory Visit

## 2020-07-02 ENCOUNTER — Telehealth: Payer: Self-pay | Admitting: Pulmonary Disease

## 2020-07-02 NOTE — Telephone Encounter (Signed)
Patient is aware of below message and voiced his understanding.  He would like to cancel procedure. Patient is aware that he will be dismissed from practice due to non compliance. Patient voiced understanding.   Dismissal letter will be written on 07/03/2020.  Procedure has been canceled.

## 2020-07-02 NOTE — Telephone Encounter (Signed)
We have mobilized an incredible amount of personnel and have set up the equipment for this procedure.  He likely has cancer and he would benefit from getting a diagnosis as soon as possible.  If he cancels again he is jeopardizing his own health.  I would also have to recommend that he seek help elsewhere because we have scheduled him already 3 times and he has failed to follow instructions as required.

## 2020-07-02 NOTE — Telephone Encounter (Addendum)
Spoke to patient, who is questioning if he can reschedule his bronch that is scheduled for 07/04/2020. 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  THIS IS THE FOURTH NS/CANCELLATION FOR [facility]   Dr. Patsey Berthold, please advise. Thanks

## 2020-07-03 ENCOUNTER — Other Ambulatory Visit: Admission: RE | Admit: 2020-07-03 | Payer: Self-pay | Source: Ambulatory Visit

## 2020-07-03 ENCOUNTER — Encounter: Payer: Self-pay | Admitting: Pulmonary Disease

## 2020-07-03 NOTE — Telephone Encounter (Signed)
As discussed

## 2020-07-03 NOTE — Telephone Encounter (Signed)
Dismissal letter has been mailed to address on file.  Dismissal form has been faxed to medical records.  Received successful fax confirmation.  Nothing further needed at this time.

## 2020-07-03 NOTE — Telephone Encounter (Signed)
Dr. Patsey Berthold, what you would like dismissal letter to state?

## 2020-07-04 ENCOUNTER — Encounter: Admission: RE | Payer: Self-pay | Source: Home / Self Care

## 2020-07-04 ENCOUNTER — Telehealth: Payer: Self-pay | Admitting: Pulmonary Disease

## 2020-07-04 ENCOUNTER — Ambulatory Visit: Admission: RE | Admit: 2020-07-04 | Payer: Self-pay | Source: Home / Self Care | Admitting: Pulmonary Disease

## 2020-07-04 SURGERY — VIDEO BRONCHOSCOPY WITH ENDOBRONCHIAL NAVIGATION
Anesthesia: General | Laterality: Left

## 2020-07-04 NOTE — Telephone Encounter (Signed)
Patient dismissed from [facility] by Vernard Gambles, MD, effective 07/03/20. Dismissal Letter sent out by 1st class mail. KLM

## 2020-11-19 ENCOUNTER — Other Ambulatory Visit: Payer: Self-pay

## 2020-11-19 ENCOUNTER — Emergency Department
Admission: EM | Admit: 2020-11-19 | Discharge: 2020-11-19 | Disposition: A | Discharge status: Other Acute Inpatient Hospital | Payer: Medicaid Other | Attending: Emergency Medicine | Admitting: Emergency Medicine

## 2020-11-19 ENCOUNTER — Emergency Department: Payer: Medicaid Other

## 2020-11-19 DIAGNOSIS — I619 Nontraumatic intracerebral hemorrhage, unspecified: Secondary | ICD-10-CM | POA: Insufficient documentation

## 2020-11-19 DIAGNOSIS — C719 Malignant neoplasm of brain, unspecified: Secondary | ICD-10-CM | POA: Insufficient documentation

## 2020-11-19 DIAGNOSIS — F17210 Nicotine dependence, cigarettes, uncomplicated: Secondary | ICD-10-CM | POA: Insufficient documentation

## 2020-11-19 DIAGNOSIS — I618 Other nontraumatic intracerebral hemorrhage: Secondary | ICD-10-CM | POA: Diagnosis not present

## 2020-11-19 DIAGNOSIS — G935 Compression of brain: Secondary | ICD-10-CM | POA: Diagnosis not present

## 2020-11-19 DIAGNOSIS — I10 Essential (primary) hypertension: Secondary | ICD-10-CM | POA: Diagnosis not present

## 2020-11-19 DIAGNOSIS — D496 Neoplasm of unspecified behavior of brain: Secondary | ICD-10-CM | POA: Diagnosis not present

## 2020-11-19 DIAGNOSIS — J449 Chronic obstructive pulmonary disease, unspecified: Secondary | ICD-10-CM | POA: Diagnosis not present

## 2020-11-19 DIAGNOSIS — G40109 Localization-related (focal) (partial) symptomatic epilepsy and epileptic syndromes with simple partial seizures, not intractable, without status epilepticus: Secondary | ICD-10-CM | POA: Diagnosis not present

## 2020-11-19 DIAGNOSIS — R569 Unspecified convulsions: Secondary | ICD-10-CM | POA: Diagnosis not present

## 2020-11-19 LAB — DIFFERENTIAL
Abs Immature Granulocytes: 0.05 10*3/uL (ref 0.00–0.07)
Basophils Absolute: 0.1 10*3/uL (ref 0.0–0.1)
Basophils Relative: 1 %
Eosinophils Absolute: 0.1 10*3/uL (ref 0.0–0.5)
Eosinophils Relative: 1 %
Immature Granulocytes: 0 %
Lymphocytes Relative: 16 %
Lymphs Abs: 1.8 10*3/uL (ref 0.7–4.0)
Monocytes Absolute: 0.5 10*3/uL (ref 0.1–1.0)
Monocytes Relative: 4 %
Neutro Abs: 8.8 10*3/uL — ABNORMAL HIGH (ref 1.7–7.7)
Neutrophils Relative %: 78 %

## 2020-11-19 LAB — APTT: aPTT: 35 seconds (ref 24–36)

## 2020-11-19 LAB — CBC
HCT: 39.7 % (ref 39.0–52.0)
Hemoglobin: 13.3 g/dL (ref 13.0–17.0)
MCH: 27.7 pg (ref 26.0–34.0)
MCHC: 33.5 g/dL (ref 30.0–36.0)
MCV: 82.5 fL (ref 80.0–100.0)
Platelets: 318 10*3/uL (ref 150–400)
RBC: 4.81 MIL/uL (ref 4.22–5.81)
RDW: 13.9 % (ref 11.5–15.5)
WBC: 11.4 10*3/uL — ABNORMAL HIGH (ref 4.0–10.5)
nRBC: 0 % (ref 0.0–0.2)

## 2020-11-19 LAB — COMPREHENSIVE METABOLIC PANEL
ALT: 14 U/L (ref 0–44)
AST: 19 U/L (ref 15–41)
Albumin: 3.4 g/dL — ABNORMAL LOW (ref 3.5–5.0)
Alkaline Phosphatase: 31 U/L — ABNORMAL LOW (ref 38–126)
Anion gap: 8 (ref 5–15)
BUN: 9 mg/dL (ref 8–23)
CO2: 25 mmol/L (ref 22–32)
Calcium: 8.7 mg/dL — ABNORMAL LOW (ref 8.9–10.3)
Chloride: 103 mmol/L (ref 98–111)
Creatinine, Ser: 0.88 mg/dL (ref 0.61–1.24)
GFR, Estimated: >60 mL/min (ref >60)
Glucose, Bld: 83 mg/dL (ref 70–99)
Potassium: 3 mmol/L — ABNORMAL LOW (ref 3.5–5.1)
Sodium: 136 mmol/L (ref 135–145)
Total Bilirubin: 0.6 mg/dL (ref 0.3–1.2)
Total Protein: 7.9 g/dL (ref 6.5–8.1)

## 2020-11-19 LAB — PROTIME-INR
INR: 1.1 (ref 0.8–1.2)
Prothrombin Time: 13.9 seconds (ref 11.4–15.2)

## 2020-11-19 LAB — CBG MONITORING, ED: Glucose-Capillary: 91 mg/dL (ref 70–99)

## 2020-11-19 MED ORDER — SODIUM CHLORIDE 0.9 % IV SOLN
750.0000 mg | Freq: Two times a day (BID) | INTRAVENOUS | Status: DC
  Filled 2020-11-19: qty 7.5

## 2020-11-19 MED ORDER — SODIUM CHLORIDE 0.9% FLUSH
3.0000 mL | Freq: Once | INTRAVENOUS | Status: AC
  Administered 2020-11-19: 3 mL via INTRAVENOUS

## 2020-11-19 MED ORDER — GADOBUTROL 1 MMOL/ML IV SOLN
7.5000 mL | Freq: Once | INTRAVENOUS | Status: AC | PRN
  Administered 2020-11-19: 7.5 mL via INTRAVENOUS

## 2020-11-19 MED ORDER — SODIUM CHLORIDE 0.9 % IV SOLN
60.0000 mg/kg | Freq: Once | INTRAVENOUS | Status: AC
  Administered 2020-11-19: 5230 mg via INTRAVENOUS
  Filled 2020-11-19: qty 52.3

## 2020-11-19 MED ORDER — LORAZEPAM 2 MG/ML IJ SOLN
1.0000 mg | Freq: Once | INTRAMUSCULAR | Status: AC
  Administered 2020-11-19: 1 mg via INTRAVENOUS

## 2020-11-19 MED ORDER — LORAZEPAM 2 MG/ML IJ SOLN
INTRAMUSCULAR | Status: AC
  Administered 2020-11-19: 1 mg
  Filled 2020-11-19: qty 1

## 2020-11-19 MED ORDER — DEXAMETHASONE SODIUM PHOSPHATE 10 MG/ML IJ SOLN
10.0000 mg | Freq: Once | INTRAMUSCULAR | Status: AC
  Administered 2020-11-19: 10 mg via INTRAVENOUS
  Filled 2020-11-19: qty 1

## 2020-11-19 MED ORDER — SODIUM CHLORIDE 0.9 % IV SOLN: Freq: Once | INTRAVENOUS | Status: AC

## 2020-11-19 MED ORDER — CLEVIDIPINE BUTYRATE 0.5 MG/ML IV EMUL
0.0000 mg/h | INTRAVENOUS | Status: DC
  Administered 2020-11-19: 1 mg/h via INTRAVENOUS
  Filled 2020-11-19: qty 50

## 2020-11-19 NOTE — Code Documentation (Signed)
Stroke Response Nurse Documentation Code Documentation  Connor Kline is a 60 y.o. male arriving to [facility] ED via [facility] on 11/19/2020 with past medical hx of HTN and lung mass. On  no anticoagulation medications . Code stroke was activated by EMS with [facility] of 2.   Patient from home where he was LKW at 1215 and now complaining of left sided facial droop, left arm weakness and left sided jerky movements. Pts mother called 36.   Stroke team at the bedside on patient arrival. Labs drawn and patient cleared for CT by Dr. Cinda Quest. Patient to CT with team. NIHSS 7, see documentation for details and code stroke times. The following imaging was completed:  CT head. Patient is not a candidate for IV Thrombolytic due to CT imaging results.  Care/Plan: Per Dr Quinn Axe, pt likely has a hemorrhagic brain mass and will need BP control to maintain SBP<150 and will need to transfer ED-ED to [facility].   Bedside handoff with ED RN Burman Nieves.    Velta Addison Stroke Response RN

## 2020-11-19 NOTE — ED Notes (Signed)
Patient remains in MRI 

## 2020-11-19 NOTE — ED Notes (Signed)
Neurology parameters for blood pressure sbp <160, cleviprex at bedside

## 2020-11-19 NOTE — Consult Note (Addendum)
NEUROLOGY CONSULTATION NOTE   Date of service: November 19, 2020 Patient Name: Connor Kline MRN:  [id number] DOB:  [birth year] Reason for consult: stroke code Requesting physician: Dr. Conni Slipper _ _ _   _ __   _ __ _ _  __ __   _ __   __ _  PATIENT STATES BEST CONTACT PERSON IS MOTHER [name] [phone]  History of Present Illness   60 year old gentleman with a past medical history of COPD, hypertension, known lung mass who presents with left facial droop and left arm jerking.  His last known well was 1215 today.  At that time he became confused, developed left facial droop and left facial twitching, and rhythmic jerking of his left upper extremity.  Stroke code was called on arrival.  Due to concern for seizure patient received 1 mg of Ativan which resolved his motor symptoms.  On my examination he was mildly lethargic and after approximately 5 minutes again began having twitching of his left face and his left arm.  Other than a left facial droop he had no focal deficits.  He again received 1 mg of Ativan and was loaded with 60 mix per cake of IV Keppra. 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 ?possible edema in R cerebellum possibly secondary to second lesion. Personally reviewed. Clevidipine was started for goal SBP < 150. Patient not on anticoagulation or antiplatelets, plt count normal.  TNK not administered 2/2 sx not secondary to stroke and contraindication of hemorrhagic brain mass.  Patient not aware of a mass in his brain. Has never had a seizure before. He was previously followed by [facility] pulmonology for lung mass but he was discharged from the practice after canceling/rescheduling diagnostic bronch 4 times and he has not seen a pulm since May 2022. Hx cocaine abuse.   Patient confirms he wishes to be full code.  CT head 1. 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 Right-to-left shift of 5 mm. 2. Question edema in the right cerebellum that could indicate the presence of a second lesion in that location. 3. Old ischemic changes of the white matter.  Personally reviewed     ROS   Per HPI: all other systems reviewed and are negative  Past History   I have reviewed the following:  Past Medical History:  Diagnosis Date  . COPD (chronic obstructive pulmonary disease) ([place])   . Dyspnea   . Hypertension   . Lung mass    Past Surgical History:  Procedure Laterality Date  . KNEE SURGERY Right    several years ago as a teenager    Family History  Problem Relation Age of Onset  . Cancer Mother   . Stroke Father   . Heart attack Father    Social History   Socioeconomic History  . Marital status: Single    Spouse name: Not on file  . Number of children: Not on file  . Years of education: Not on file  . Highest education level: Not on file  Occupational History  . Not on file  Tobacco Use  . Smoking status: Every Day    Packs/day: 0.50    Years: 45.00    Pack years: 22.50    Types: Cigarettes  . Smokeless tobacco: Never  . Tobacco comments:    about 3 a  day 4 days out of the week.  Vaping Use  . Vaping Use: Never used  Substance and Sexual Activity  . Alcohol use: Yes    Comment: occassionally   . Drug use: Yes    Types: Cocaine, Marijuana, "Crack" cocaine    Comment: 1 week ago   . Sexual activity: Not on file  Other Topics Concern  . Not on file  Social History Narrative  . Not on file   Social Determinants of Health   Financial Resource Strain: Not on file  Food Insecurity: Not on file  Transportation Needs: Not on file  Physical Activity: Not on file  Stress: Not on file  Social Connections: Not on file   No Known Allergies  Medications     Current Facility-Administered Medications:  .  clevidipine (CLEVIPREX) infusion 0.5 mg/mL, 0-21 mg/hr,  Intravenous, Continuous, Derek Jack, MD, Last Rate: 2 mL/hr at 11/19/20 1404, 1 mg/hr at 11/19/20 1404 .  levETIRAcetam (KEPPRA) 5,230 mg in sodium chloride 0.9 % 250 mL IVPB, 60 mg/kg, Intravenous, Once, Derek Jack, MD  Current Outpatient Medications:  .  bisoprolol (ZEBETA) 5 MG tablet, Take 1 tablet (5 mg total) by mouth daily. (Patient not taking: Reported on 06/16/2020), Disp: 30 tablet, Rfl: 2 .  Garlic 3299 MG CAPS, Take 1,000 mg by mouth daily at 12 noon., Disp: , Rfl:   Vitals   Vitals:   11/19/20 1345 11/19/20 1350 11/19/20 1354 11/19/20 1406  BP: (!) 155/105 (!) 157/108  (!) 147/97  Resp: (!) 27   18  Temp:      TempSrc:      SpO2:    97%  Weight:      Height:   5\' 11"  (1.803 m)      Body mass index is 26.78 kg/m.  Physical Exam   Physical Exam Gen: drowsy, NAD HEENT: Atraumatic, normocephalic;mucous membranes moist; oropharynx clear, tongue without atrophy or fasciculations. Neck: Supple, trachea midline. Resp: CTAB, no w/r/r CV: RRR, no m/g/r; nml S1 and S2. 2+ symmetric peripheral pulses. Abd: soft/NT/ND; nabs x 4 quad Extrem: Nml bulk; no cyanosis, clubbing, or edema.  Neuro: *MS: drowsy, oriented to self and hospital, follows simple commands *Speech: moderate dysarthria, no aphasia *CN:    I: Deferred   II,III: PERRLA, VFF by confrontation, optic discs unable to be visualized 2/2 pupillary constriction   III,IV,VI: EOMI w/o nystagmus, no ptosis   V: Sensation intact from V1 to V3 to LT   VII: Eyelid closure was full.  L UMN facial droop   VIII: Hearing intact to voice   IX,X: Voice normal, palate elevates symmetrically    XI: SCM/trap 5/5 bilat   XII: Tongue protrudes midline, no atrophy or fasciculations   *Motor:   Normal bulk.  No tremor, rigidity or bradykinesia. BLE no drift, BUE drift but not to bed. *Sensory: SILT *Coordination:  FNF intact *Reflexes:  2+ and symmetric throughout without clonus; toes down-going bilat *Gait:  deferred  NIHSS = 5 (1 questions, 1 facial droop, 1 each BLE motor, 1 dysarthria)    Premorbid mRS = 1   Labs   CBC:  Recent Labs  Lab 11/19/20 1328  WBC 11.4*  NEUTROABS 8.8*  HGB 13.3  HCT 39.7  MCV 82.5  PLT 242    Basic Metabolic Panel:  Lab Results  Component Value Date   NA 136 11/19/2020   K 3.0 (L) 11/19/2020   CO2 25 11/19/2020   GLUCOSE 83 11/19/2020  BUN 9 11/19/2020   CREATININE 0.88 11/19/2020   CALCIUM 8.7 (L) 11/19/2020   GFRNONAA >60 11/19/2020   Lipid Panel: No results found for: LDLCALC HgbA1c: No results found for: HGBA1C Urine Drug Screen:     Component Value Date/Time   LABOPIA NONE DETECTED 04/28/2020 1132   COCAINSCRNUR POSITIVE (A) 04/28/2020 1132   LABBENZ NONE DETECTED 04/28/2020 1132   AMPHETMU NONE DETECTED 04/28/2020 1132   THCU NONE DETECTED 04/28/2020 1132   LABBARB NONE DETECTED 04/28/2020 1132    Alcohol Level No results found for: [place]   Impression   60 yo gentleman w/ hx COPD, HTN, known lung mass p/w focal seizure and 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  Recommendations   - ED to ED transfer to [facility]; I will make Dr. Lorrin Goodell [facility] neurohospitalist aware patient is coming - Consultation to neurology, neurosurgery, oncology, pulmonology on arrival to [facility] - S/p 1mg  IV ativan x2 and keppra load 60 mg/kg; continue 750mg  bid. Patient not actively seizing anymore. - Goal SBP <150; clevidipine titration ordered - Pt not on anticoagulation or antiplatelets - SCDs for DVT prophylaxis - HOB elevated 30 degrees - Further mgmt per [facility] interdisciplinary team - Patient confirmed he wishes to be full code.  - PATIENT STATES BEST CONTACT PERSON IS MOTHER HELEN Brilliant [phone]. I will call her to update her on plan.   This patient is critically ill and at significant risk of neurological worsening, death and care requires constant monitoring of vital signs, hemodynamics,respiratory and cardiac  monitoring, neurological assessment, discussion with family, other specialists and medical decision making of high complexity. I spent 90 minutes of neurocritical care time  in the care of  this patient. This was time spent independent of any time provided by nurse practitioner or PA.   Su Monks, MD [facility] [phone]   If 7pm- 7am, please page neurology on call as listed in [place].   Addendum 1636: Patient will be transferred to [facility] instead of [facility] this evening.  Su Monks, MD [facility] [phone]  If 7pm- 7am, please page neurology on call as listed in [place].

## 2020-11-19 NOTE — ED Notes (Signed)
Family contacts for patient: Tollie Pizza, [phone](YVGCY) [address] [phone]  (Mom)

## 2020-11-19 NOTE — ED Notes (Signed)
Pt slightly drowsy from IV ativan, easily rousable to verbal, no further extremity twitching as on arrival

## 2020-11-19 NOTE — ED Notes (Signed)
Pt drowsy following ativan, easily rouses to verbal.

## 2020-11-19 NOTE — ED Triage Notes (Signed)
Pt arrived to ED via ems from home, pt was with mother when he suddenly began having left sided facial droop, left arm weakness and jerky movements to left side of body. Pt LKW was 1215. Per ems CBG 118. BP 191/116. Pt arrived to EMS bay and was met by stroke team and EDP.

## 2020-11-19 NOTE — Consult Note (Addendum)
Referring Physician:  No referring provider defined for this encounter.  Primary Physician:  Center, [facility]  Chief Complaint:  newly diagnosed brain tumor  History of Present Illness: 11/19/2020 Connor Kline is a 60 y.o. male who presents with the chief complaint of L facial droop and arm jerking.  He was brought to ER with onset of seizure like symptoms in the ER that arrested with ativan.  He then began having another seizure and received additional ativan, which has arrested the seizure.  He has gotten keppra as well.  He was unable to give history due to being sleepy after ativan (Level 5 qualifier)   Review of Systems:  A 10 point review of systems is not reviewed due to patient being unable to answer complex questions.  Past Medical History: Past Medical History:  Diagnosis Date   COPD (chronic obstructive pulmonary disease) ([place])    Dyspnea    Hypertension    Lung mass     Past Surgical History: Past Surgical History:  Procedure Laterality Date   KNEE SURGERY Right    several years ago as a teenager     Allergies: Allergies as of 11/19/2020   (No Known Allergies)    Medications:  Current Facility-Administered Medications:    clevidipine (CLEVIPREX) infusion 0.5 mg/mL, 0-21 mg/hr, Intravenous, Continuous, Connor Jack, MD, Stopped at 11/19/20 1511   dexamethasone (DECADRON) injection 10 mg, 10 mg, Intravenous, Once, Connor Polio, MD  Current Outpatient Medications:    bisoprolol (ZEBETA) 5 MG tablet, Take 1 tablet (5 mg total) by mouth daily. (Patient not taking: Reported on 06/16/2020), Disp: 30 tablet, Rfl: 2   Garlic 0086 MG CAPS, Take 1,000 mg by mouth daily at 12 noon., Disp: , Rfl:    Social History: Social History   Tobacco Use   Smoking status: Every Day    Packs/day: 0.50    Years: 45.00    Pack years: 22.50    Types: Cigarettes   Smokeless tobacco: Never   Tobacco comments:    about 3 a day 4 days out of the  week.  Vaping Use   Vaping Use: Never used  Substance Use Topics   Alcohol use: Yes    Comment: occassionally    Drug use: Yes    Types: Cocaine, Marijuana, "Crack" cocaine    Comment: 1 week ago     Family Medical History: Family History  Problem Relation Age of Onset   Cancer Mother    Stroke Father    Heart attack Father     Physical Examination: Vitals:   11/19/20 1510 11/19/20 1530  BP: 122/83 118/86  Pulse: 70 68  Resp: (!) 21 20  Temp:    SpO2: 98% 97%     General: Patient is sleepy but arousable.   Psychiatric: Patient is non-anxious.  Head:  Pupils equal, round, and reactive to light.  ENT:  Oral mucosa appears well hydrated.  Neck:   Supple.  Full range of motion.  Respiratory: Patient is breathing without any difficulty.  Extremities: No edema.  Vascular: Palpable pulses in dorsal pedal vessels.  Skin:   On exposed skin, there are no abnormal skin lesions.  NEUROLOGICAL:  General: In no acute distress.   OE voice, regards.  Oriented to self, hospital, 2022.  Pupils equal round and reactive to light.  L facial present.  Does not participate in drift.  Does not protrude tongue.   He can move all 4 limbs well, but  is less brisk on the left.  He does not participate in detailed motor or sensory examination due to sedation from ativan.  Imaging: CT Head 11/19/20 IMPRESSION: 1. 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 Right-to-left shift of 5 mm. 2. Question edema in the right cerebellum that could indicate the presence of a second lesion in that location. 3. Old ischemic changes of the white matter. 4. These results were called by telephone at the time of interpretation on 11/19/2020 at 1:27 pm to provider Dr. Joni Kline , who verbally acknowledged these results.     Electronically Signed   By: Connor Kline M.D.   On: 11/19/2020 13:31  I have personally reviewed the  images and agree with the above interpretation.  Labs: CBC Latest Ref Rng & Units 11/19/2020 04/01/2020 03/24/2020  WBC 4.0 - 10.5 K/uL 11.4(H) 8.8 8.8  Hemoglobin 13.0 - 17.0 g/dL 13.3 14.4 15.2  Hematocrit 39.0 - 52.0 % 39.7 45.8 46.1  Platelets 150 - 400 K/uL 318 293 282       Assessment and Plan: Connor Kline is a pleasant 60 y.o. male with likely metastasis with a small amount of hemorrhage.  There is considerable edema.  - EEG monitoring per neurology - Dexamethasone 10 mg now then 4q6 - MRI brain with and without - OK to keep at [facility] for neurosurgical care, but may need to transfer due to inability to perform continuous EEG - likely to need surgical resection in the next few days after stabilization    Connor Kline K. Connor Ribas MD, [place] [facility] of Neuro[facility]

## 2020-11-19 NOTE — ED Notes (Signed)
Pt made family aware of transfer to [facility]

## 2020-11-19 NOTE — ED Notes (Signed)
Urine has not been collected, patient had an episode of urinary incontinence and jeans removed and bedding changed. Urinal at bedside.

## 2020-11-19 NOTE — ED Provider Notes (Addendum)
[facility] Emergency Department Provider Note   ____________________________________________   Event Date/Time   First MD Initiated Contact with Patient 11/19/20 1323     (approximate)  I have reviewed the triage vital signs and the nursing notes.   HISTORY  Chief Complaint No chief complaint on file.  Chief complaint is onset of strokes like symptoms today.  History limited by patient's seizure activity which is interfering with his ability to speak. HPI Connor Kline is a 60 y.o. male patient with jerking movements of the left side of face and left arm onset earlier today.  Approximately 2 hours ago as I understand.  Patient is awake and alert but having difficulty communicating because of the apparent seizure activity         Past Medical History:  Diagnosis Date   COPD (chronic obstructive pulmonary disease) ([place])    Dyspnea    Hypertension    Lung mass     Patient Active Problem List   Diagnosis Date Noted   Cocaine abuse ([place]) 04/28/2020   Mass of upper lobe of left lung 04/21/2020   Hypertension 04/21/2020    Past Surgical History:  Procedure Laterality Date   KNEE SURGERY Right    several years ago as a teenager     Prior to Admission medications   Medication Sig Start Date End Date Taking? Authorizing Provider  bisoprolol (ZEBETA) 5 MG tablet Take 1 tablet (5 mg total) by mouth daily. Patient not taking: Reported on 06/16/2020 04/18/20   Tyler Pita, MD  Garlic 4656 MG CAPS Take 1,000 mg by mouth daily at 12 noon.    [provider]    Allergies Patient has no known allergies.  Family History  Problem Relation Age of Onset   Cancer Mother    Stroke Father    Heart attack Father     Social History Social History   Tobacco Use   Smoking status: Every Day    Packs/day: 0.50    Years: 45.00    Pack years: 22.50    Types: Cigarettes   Smokeless tobacco: Never   Tobacco comments:    about 3 a day  4 days out of the week.  Vaping Use   Vaping Use: Never used  Substance Use Topics   Alcohol use: Yes    Comment: occassionally    Drug use: Yes    Types: Cocaine, Marijuana, "Crack" cocaine    Comment: 1 week ago     Review of Systems Unable to obtain due to seizure activity  ____________________________________________   PHYSICAL EXAM:  VITAL SIGNS: ED Triage Vitals  Enc Vitals Group     BP 11/19/20 1341 (!) 160/110     Pulse --      Resp 11/19/20 1341 15     Temp 11/19/20 1341 98.4 F (36.9 C)     Temp Source 11/19/20 1341 Oral     SpO2 11/19/20 1341 97 %     Weight 11/19/20 1334 192 lb (87.1 kg)     Height 11/19/20 1354 5\' 11"  (1.803 m)     Head Circumference --      Peak Flow --      Pain Score 11/19/20 1341 0     Pain Loc --      Pain Edu? --      Excl. in [place]? --     Constitutional: Alert and oriented.  Having active focal seizures of the left arm and left side  of the face Eyes: Conjunctivae are normal. PER Head: Atraumatic. Nose: No congestion/rhinnorhea. Mouth/Throat: Mucous membranes are moist.  Oropharynx non-erythematous. Neck: No stridor.   Cardiovascular: Normal rate, regular rhythm. Grossly normal heart sounds.  Good peripheral circulation. Respiratory: Normal respiratory effort.  No retractions. Lungs CTAB. Gastrointestinal: Soft and nontender. No distention. No abdominal bruits.  Musculoskeletal: No lower extremity tenderness nor edema.   Neurologic: Patient able to find words without difficulty but speech is very slurry.  Difficult to understand.  After Ativan twice a squirts twitching is much less.  Has Keppra ordered as well 60 mg/kg per neurology.  Patient has some droopiness of the left side of the face and difficulty moving the tongue well his left arm is weak especially in the hands.  Otherwise brief exam in the emergency department by myself prior to the neurologist seeing him is nonfocal. Skin:  Skin is warm, dry and intact. No rash  noted. Psychiatric: Mood and affect are normal. Speech and behavior are normal.  ____________________________________________   LABS (all labs ordered are listed, but only abnormal results are displayed)  Labs Reviewed  CBC - Abnormal; Notable for the following components:      Result Value   WBC 11.4 (*)    All other components within normal limits  DIFFERENTIAL - Abnormal; Notable for the following components:   Neutro Abs 8.8 (*)    All other components within normal limits  COMPREHENSIVE METABOLIC PANEL - Abnormal; Notable for the following components:   Potassium 3.0 (*)    Calcium 8.7 (*)    Albumin 3.4 (*)    Alkaline Phosphatase 31 (*)    All other components within normal limits  PROTIME-INR  APTT  RAPID URINE DRUG SCREEN, HOSP PERFORMED  CBG MONITORING, ED  I-STAT CREATININE, ED   ____________________________________________  EKG  EKG read interpreted by me shows normal sinus rhythm rate of 83 normal axis abnormal R wave progression possibly due to lead placement. ____________________________________________  RADIOLOGY Gertha Calkin, personally viewed and evaluated these images (plain radiographs) as part of my medical decision making, as well as reviewing the written report by the radiologist.  ED MD interpretation: Chest x-ray reviewed by me shows multiple lung nodules.  CT reviewed by me read by radiology shows brain mass with hemorrhage and edema.  Official radiology report(s): CT HEAD CODE STROKE WO CONTRAST  Result Date: 11/19/2020 CLINICAL DATA:  Code stroke. Neuro deficit, acute, stroke suspected. Left-sided weakness and slurred speech. EXAM: CT HEAD WITHOUT CONTRAST TECHNIQUE: Contiguous axial images were obtained from the base of the skull through the vertex without intravenous contrast. COMPARISON:  None. FINDINGS: Brain: 3 x 3 x 3.5 cm in diameter mass in the right frontal operculum with petechial blood products and regional vasogenic edema  consistent with metastatic disease. Question edema in the right cerebellum which could also go along with a mass lesion in that location. Small-vessel ischemic changes are noted affecting the white matter elsewhere. There is mass effect with right-to-left shift of 5 mm. No hydrocephalus. No extra-axial collection. Vascular: There is atherosclerotic calcification of the major vessels at the base of the brain. Skull: Negative Sinuses/Orbits: Clear/normal Other: None IMPRESSION: 1. 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 Right-to-left shift of 5 mm. 2. Question edema in the right cerebellum that could indicate the presence of a second lesion in that location. 3. Old ischemic changes of the white matter. 4. These  results were called by telephone at the time of interpretation on 11/19/2020 at 1:27 pm to provider Dr. Joni Fears , who verbally acknowledged these results. Electronically Signed   By: Nelson Chimes M.D.   On: 11/19/2020 13:31    ____________________________________________   PROCEDURES  Procedure(s) performed (including Critical Care): Critical care time 1 hour and 20 minutes.  This includes evaluating the patient in the hallway prior to going to CT scan reviewing the CT scan and chest x-ray speaking with the neurologist twice reevaluating the patient 2 more times after the neurologist saw him to check on his progress.  I also spoke with Dr. Jeanell Sparrow in the emergency department at [facility] to arrange transfer.   ----------------------------------------- 4:43 PM on 11/19/2020 ----------------------------------------- I have now spoken with her texted Dr. Jeanell Sparrow in the emergency room again the neurosurgeon at [facility] our neurologist and neurosurgeon repeatedly I have spoken with [facility] twice and [facility] about transferring the patient.  I have gone back and checked on the patient again. I have been texting with multiple people about  transferring the patient as well. Procedures   ____________________________________________   INITIAL IMPRESSION / ASSESSMENT AND PLAN / ED COURSE Review of old records shows that he was noted to have lung masses in February and these were in the process of being worked up he has had 2 CT scans last 1 was in June which showed increasing size of the mass.              ____________________________________________   FINAL CLINICAL IMPRESSION(S) / ED DIAGNOSES  Final diagnoses:  Focal seizure ([place])  Neoplasm of brain causing mass effect on adjacent structures [facility])  Intraparenchymal hemorrhage of brain ([place])  Hemorrhagic stroke [facility])     ED Discharge Orders     None        Note:  This document was prepared using Dragon voice recognition software and may include unintentional dictation errors.    Nena Polio, MD 11/19/20 1417    Nena Polio, MD 11/19/20 [phone]

## 2022-02-24 IMAGING — CT CT HEAD CODE STROKE
3 series · 15 of 47 positions shown, 18 images · non-contrast
Comparison: None.

CLINICAL DATA: Code stroke. Neuro deficit, acute, stroke suspected.
Left-sided weakness and slurred speech.

EXAM:
CT HEAD WITHOUT CONTRAST
TECHNIQUE: Contiguous axial images were obtained from the base of the skull
through the vertex without intravenous contrast.

[Series 4: head wo · axial · 0.43mm/px · z∈[+157,+282]mm · 9 of 30 slices shown, 12 images]
[im 3/30  brain]
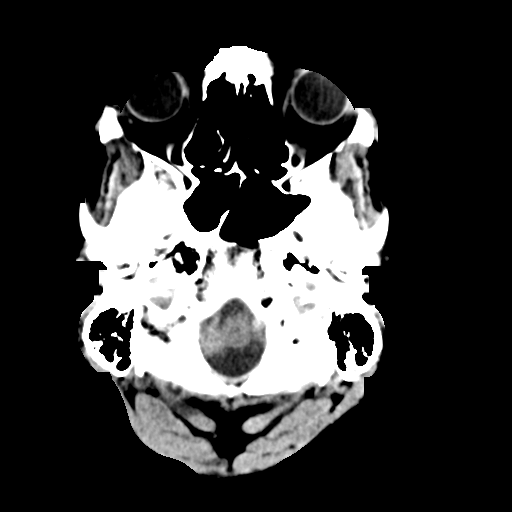
[im 3/30  bone]
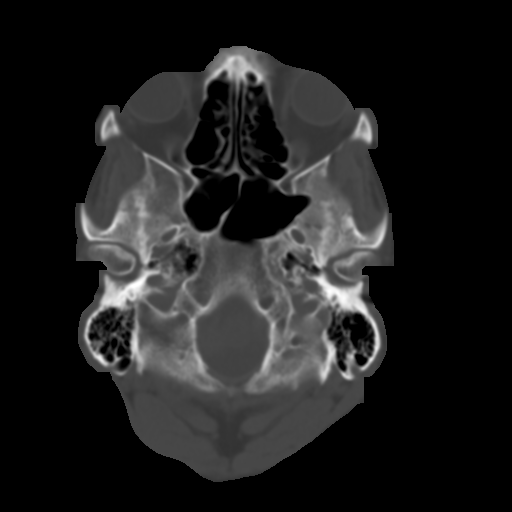
[im 6/30  brain]
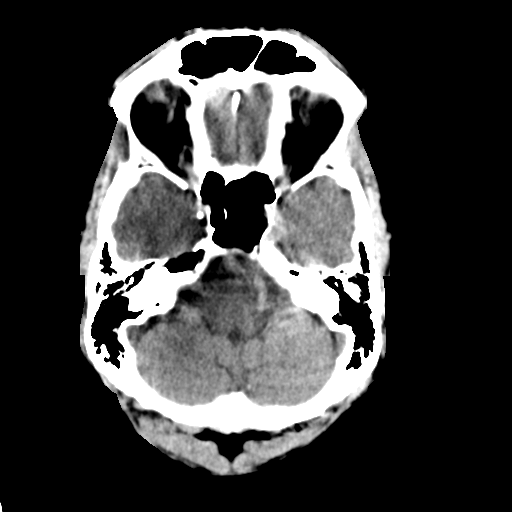
[im 9/30  brain]
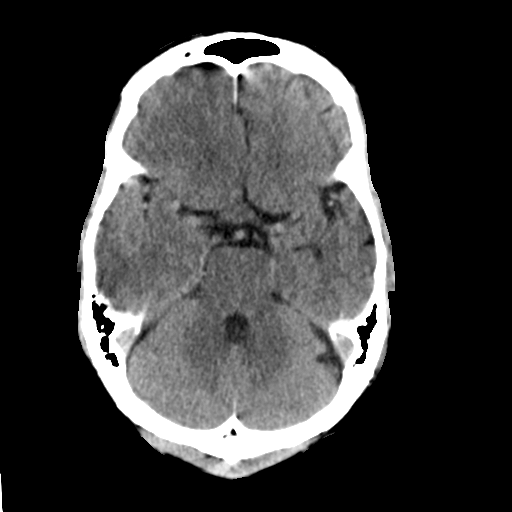
[im 12/30  brain]
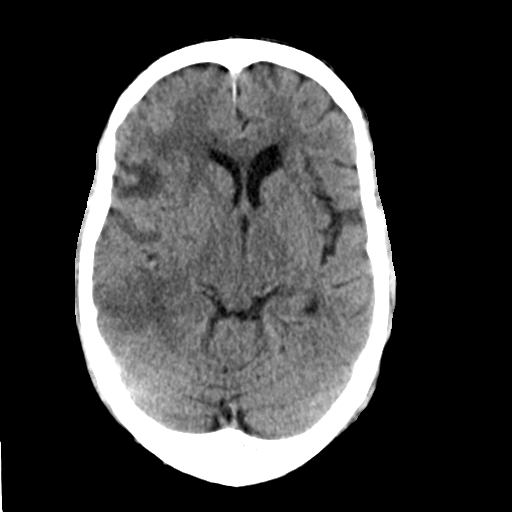
[im 16/30  brain]
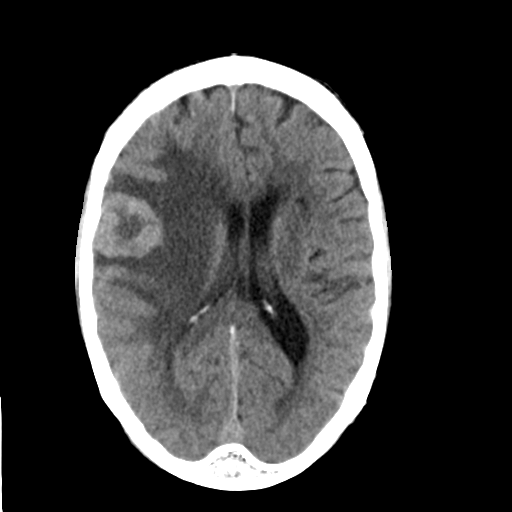
[im 16/30  bone]
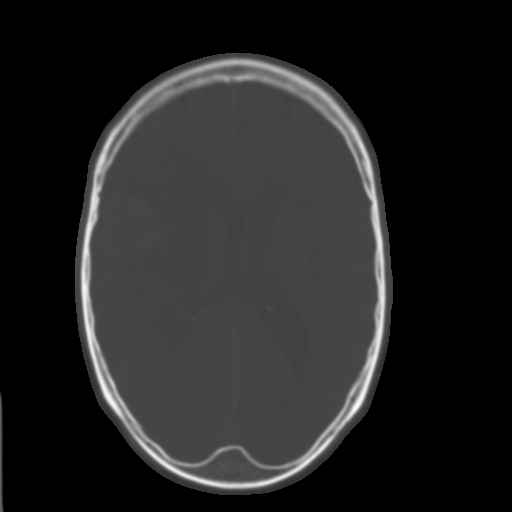
[im 19/30  brain]
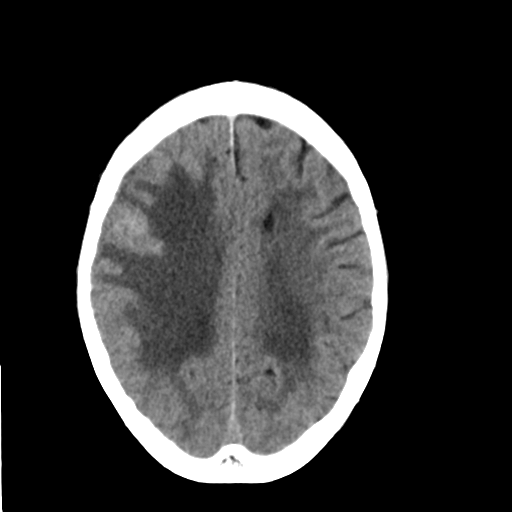
[im 22/30  brain]
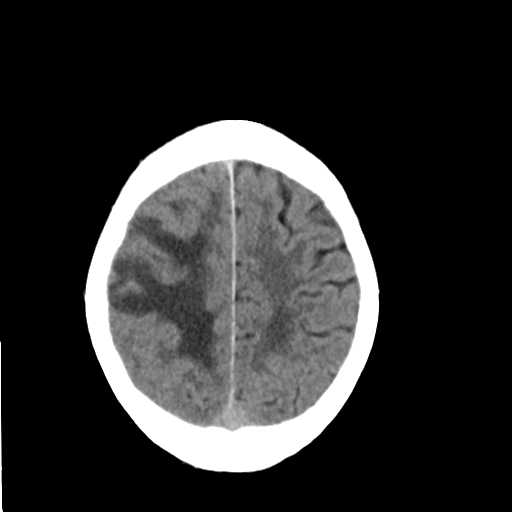
[im 25/30  brain]
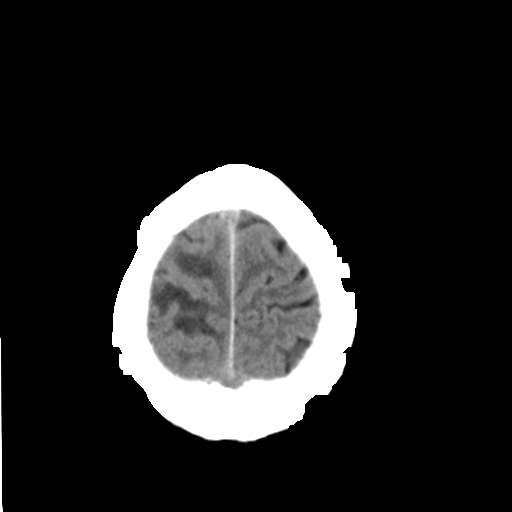
[im 28/30  brain]
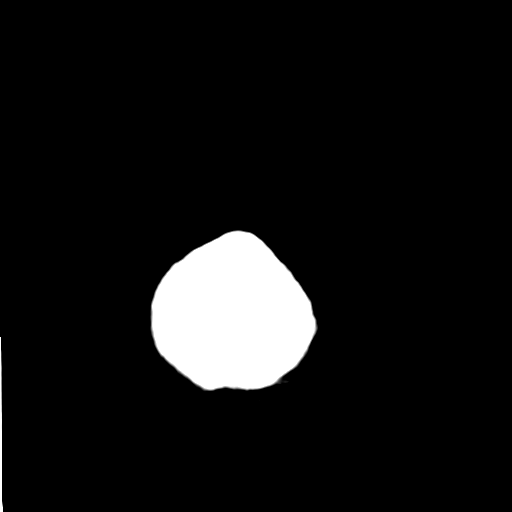
[im 28/30  bone]
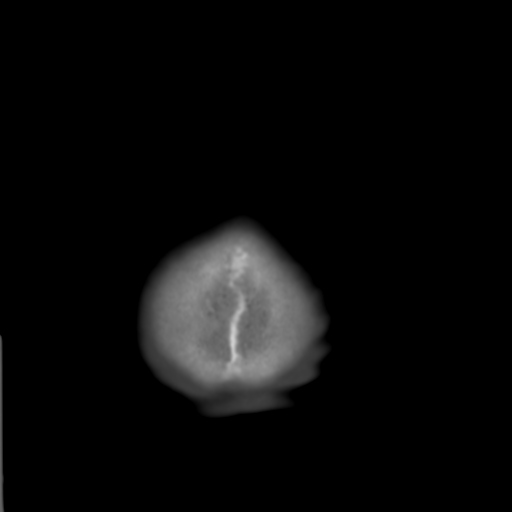

[Series 5: coronal soft tissue · coronal · 0.32mm/px · 3 of 69 slices shown]
[im 23/69  brain]
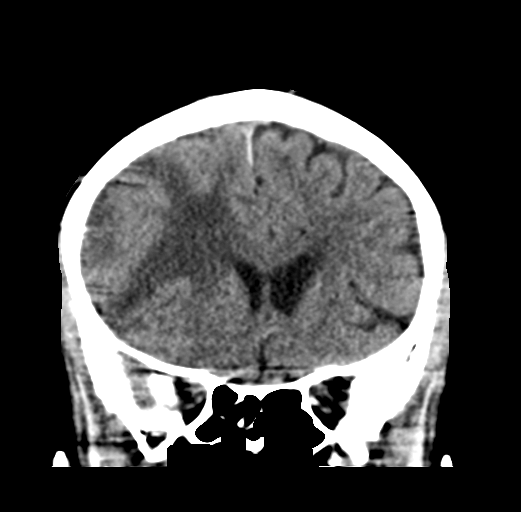
[im 31/69  brain]
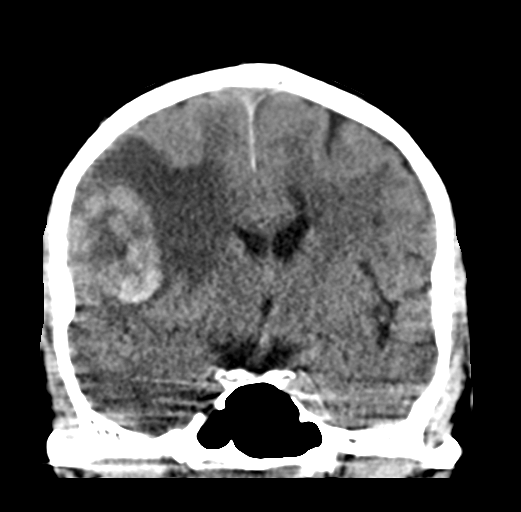
[im 38/69  brain]
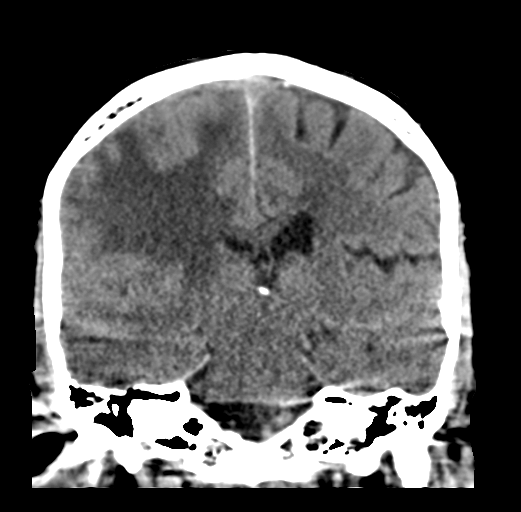

[Series 6: sagittal soft tissue · sagittal · 0.34mm/px · 3 of 53 slices shown]
[im 18/53  brain]
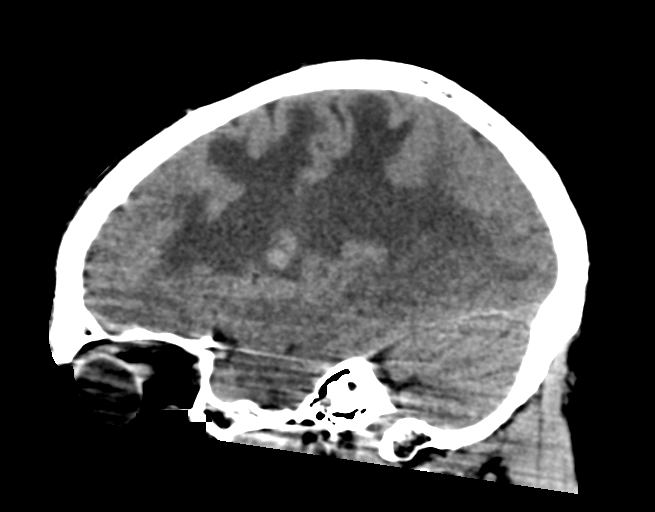
[im 27/53  brain]
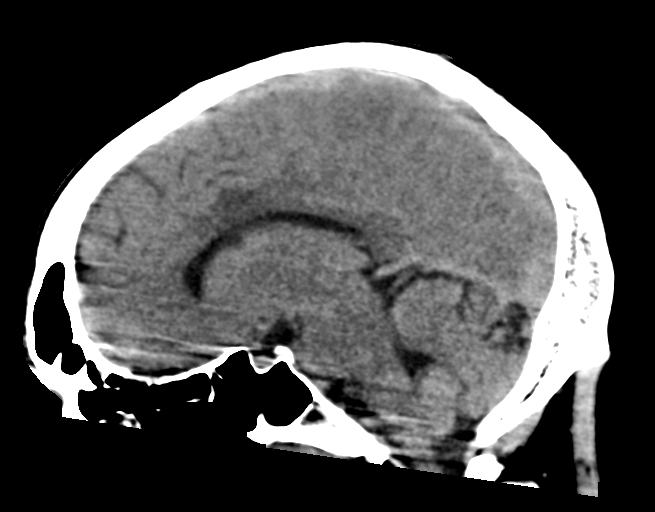
[im 35/53  brain]
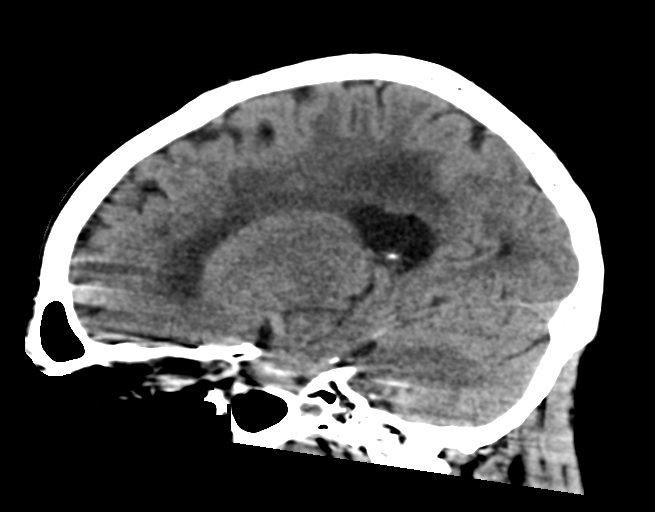

[15 of 47 positions shown; findings below may reference images not displayed]

FINDINGS: Brain: 3 x 3 x 3.5 cm in diameter mass in the right frontal
operculum with petechial blood products and regional vasogenic edema
consistent with metastatic disease. Question edema in the right
cerebellum which could also go along with a mass lesion in that
location. Small-vessel ischemic changes are noted affecting the
white matter elsewhere. There is mass effect with right-to-left
shift of 5 mm. No hydrocephalus. No extra-axial collection.

Vascular: There is atherosclerotic calcification of the major
vessels at the base of the brain.

Skull: Negative

Sinuses/Orbits: Clear/normal

Other: None
IMPRESSION: 1. Hemorrhagic metastasis of the right frontal operculum measuring
3.5 x 3 x 3 cm, with extensive regional edema. Right-to-left shift
of 5 mm.
2. Question edema in the right cerebellum that could indicate the
presence of a second lesion in that location.
3. Old ischemic changes of the white matter.
4. These results were called by telephone at the time of
interpretation on 11/19/2020 at [DATE] to provider Dr. Vinay ,
who verbally acknowledged these results.

## 2022-02-24 IMAGING — MR MR HEAD WO/W CM
18 series · 45 of 48 positions shown · IV contrast (7.5ml Gadavist)
Comparison: Prior CT from earlier the same day.

CLINICAL DATA: Initial evaluation for cancer of unknown primary,
staging, left facial droop, arm jerking.

EXAM:
MRI HEAD WITHOUT AND WITH CONTRAST
TECHNIQUE: Multiplanar, multiecho pulse sequences of the brain and surrounding
structures were obtained without and with intravenous contrast.
CONTRAST:  7.5mL GADAVIST GADOBUTROL 1 MMOL/ML IV SOLN

[Series 5: ax dwi_tracew · axial · 3.0mm · 1.80mm/px · z∈[-123,+29]mm · 2 of 48 slices shown]
[im 1/48]
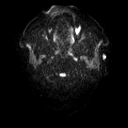
[im 48/48]
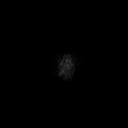

[Series 6: ax dwi_adc · axial · 3.0mm · 1.80mm/px · z∈[-123,+29]mm · 2 of 48 slices shown]
[im 1/48]
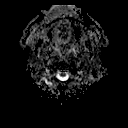
[im 48/48]
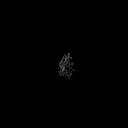

[Series 7: cor dwi_tracew · coronal · 5.0mm · 1.80mm/px · 2 of 36 slices shown]
[im 1/36]
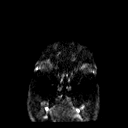
[im 36/36]
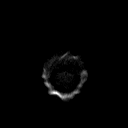

[Series 8: cor dwi_adc · coronal · 5.0mm · 1.80mm/px · 2 of 36 slices shown]
[im 1/36]
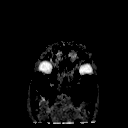
[im 36/36]
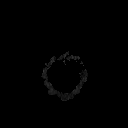

[Series 9: T1 · sagittal · 5.0mm · 0.62mm/px · 1 of 21 slices shown (1 of 2)]
[im 1/21]
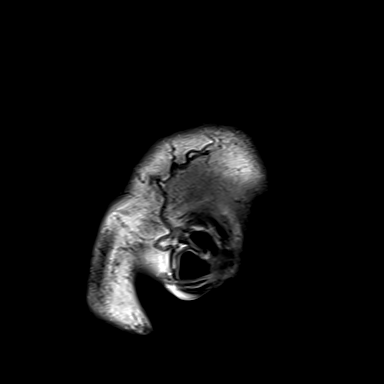

[Series 10: T2 · axial · 5.0mm · 0.86mm/px · 1 of 25 slices shown (1 of 2)]
[im 1/25]
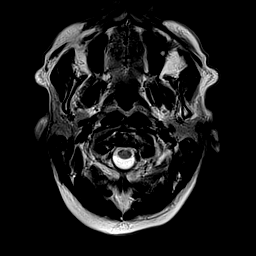

[Series 12: pha_images · axial · 3.0mm · 0.90mm/px · z∈[-122,+22]mm · 2 of 50 slices shown]
[im 1/50]
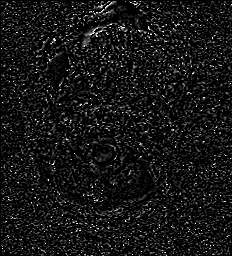
[im 50/50]
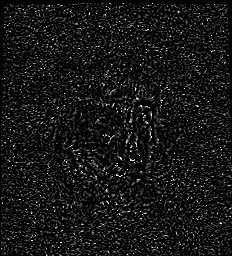

[Series 13: swi_images · axial · 3.0mm · 0.90mm/px · z∈[-122,+28]mm · 2 of 52 slices shown]
[im 1/52]
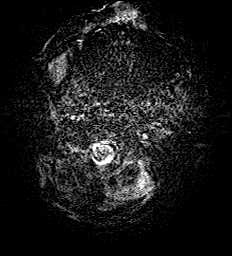
[im 52/52]
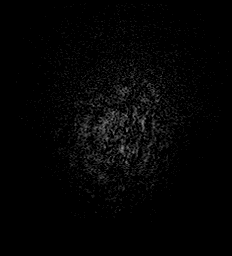

[Series 15: FLAIR · axial · 3.0mm · 0.69mm/px · z∈[-125,+33]mm · 3 of 55 slices shown (1 of 2)]
[im 1/55]
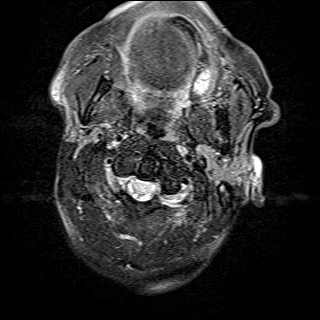
[im 28/55]
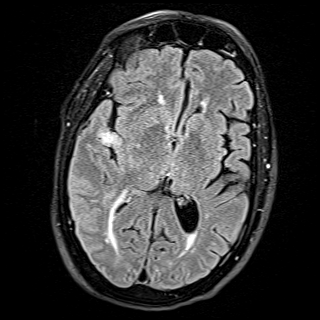
[im 55/55]
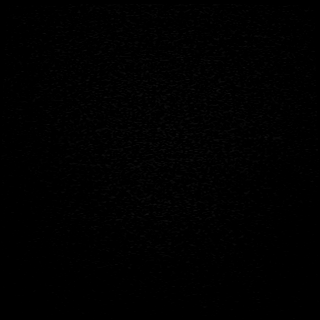

[Series 16: T1 · axial · 1.0mm · 0.98mm/px · z∈[-127,+29]mm · 7 of 160 slices shown (2 of 2)]
[im 1/160]
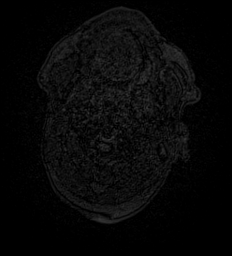
[im 27/160]
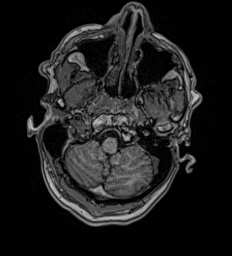
[im 54/160]
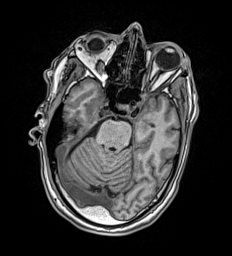
[im 80/160]
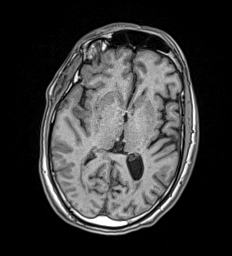
[im 107/160]
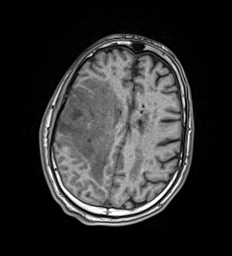
[im 133/160]
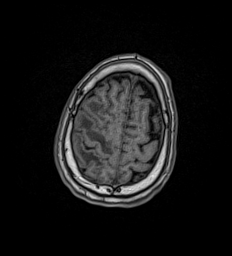
[im 160/160]
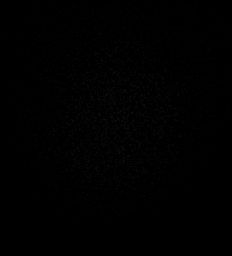

[Series 17: T2 · coronal · 3.0mm · 0.47mm/px · 2 of 35 slices shown (2 of 2)]
[im 1/35]
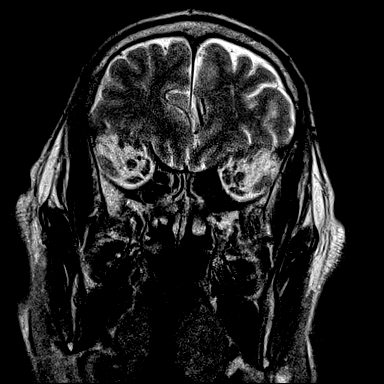
[im 35/35]
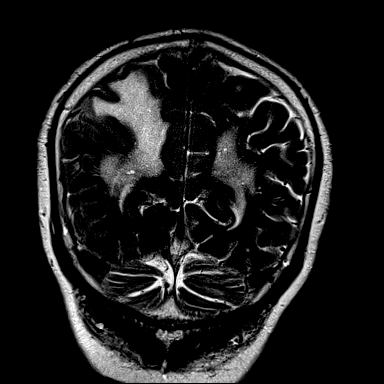

[Series 18: FLAIR · coronal · 3.0mm · 0.47mm/px · 2 of 35 slices shown (2 of 2)]
[im 1/35]
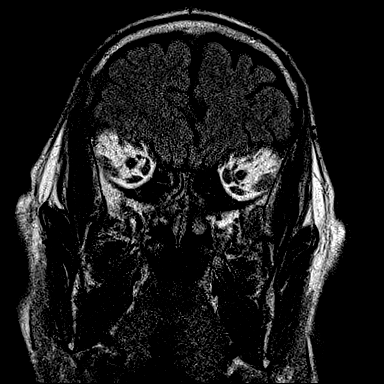
[im 35/35]
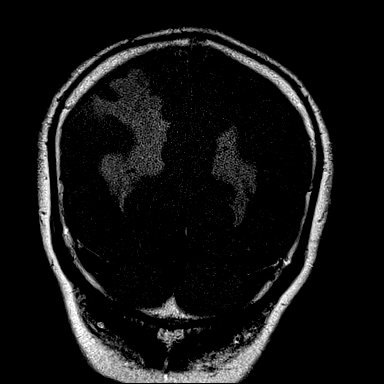

[Series 20: T1 post-contrast · axial · 1.0mm · 0.98mm/px · z∈[-127,+29]mm · 7 of 158 slices shown (1 of 3)]
[im 1/158]
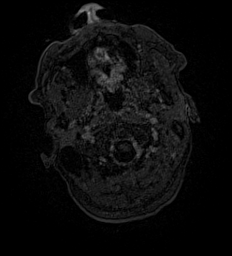
[im 27/158]
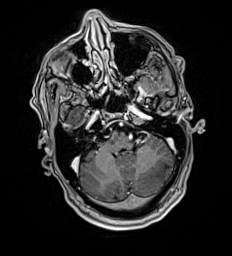
[im 53/158]
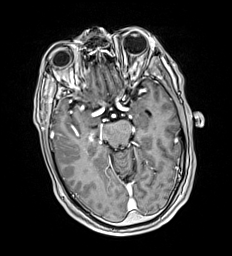
[im 79/158]
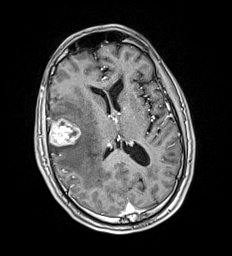
[im 105/158]
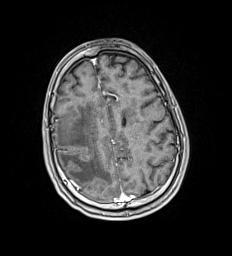
[im 131/158]
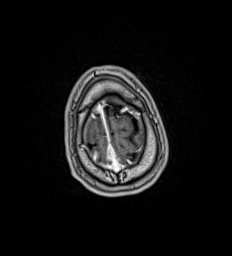
[im 158/158]
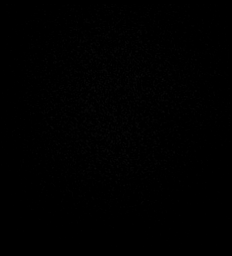

[Series 26: T1 post-contrast · coronal · 5.0mm · 0.57mm/px · 1 of 28 slices shown (2 of 3)]
[im 1/28]
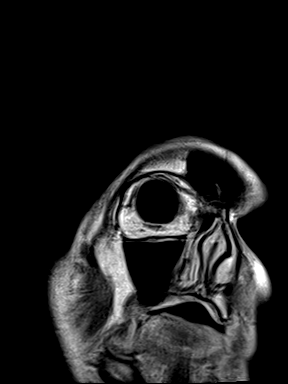

[Series 27: T1 post-contrast · sagittal · 5.0mm · 0.62mm/px · 1 of 21 slices shown (3 of 3)]
[im 1/21]
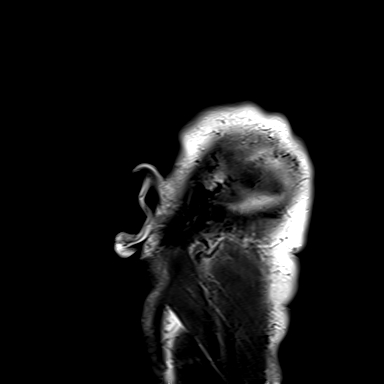

[Series 28: T2 post-contrast · coronal · 5.0mm · 0.86mm/px · 1 of 29 slices shown]
[im 1/29]
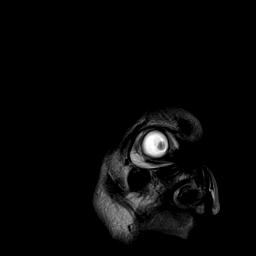

[Series 1020: cor thins rl · coronal · 3.0mm · 0.49mm/px · 5 of 107 slices shown (1 of 2)]
[im 1/107]
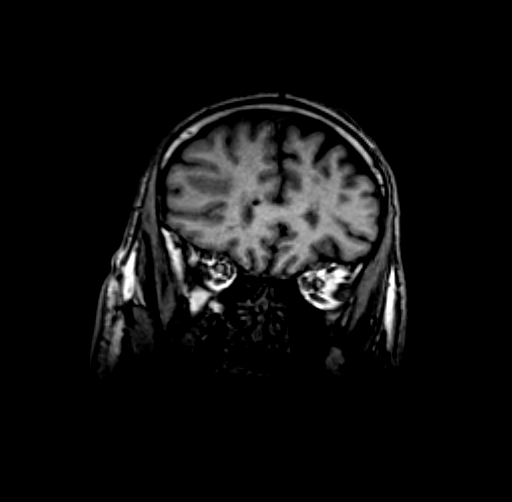
[im 27/107]
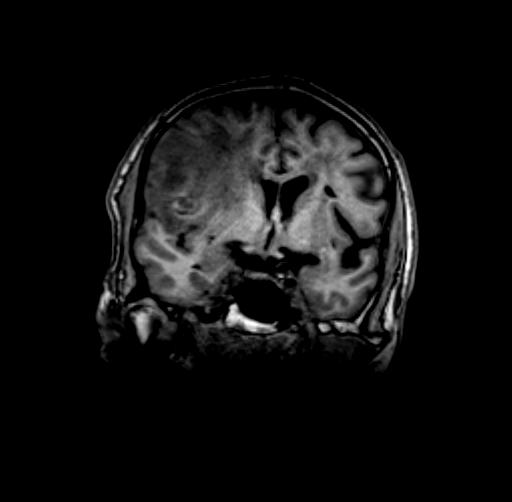
[im 54/107]
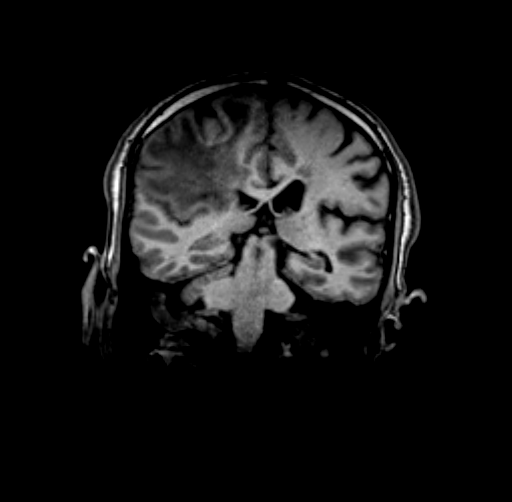
[im 80/107]
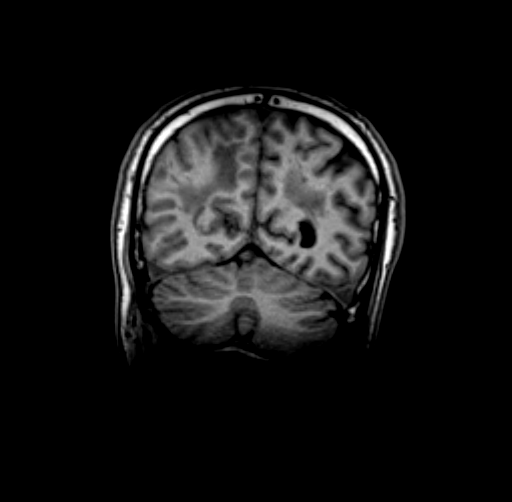
[im 107/107]
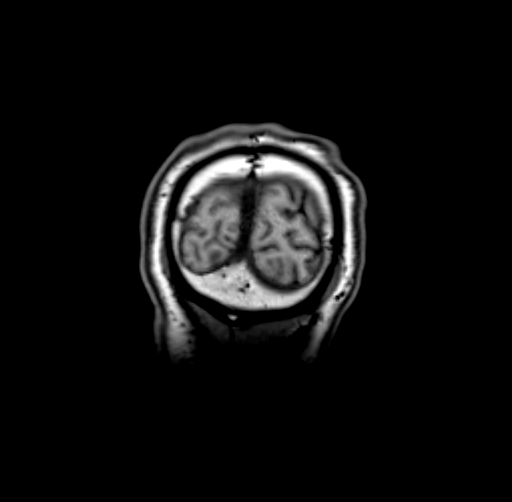

[Series 1033: cor thins rl · coronal · 3.0mm · 0.40mm/px · 2 of 107 slices shown (2 of 2)]
[im 1/107]
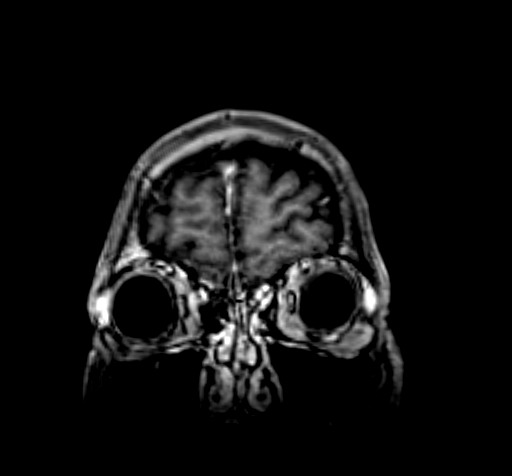
[im 27/107]
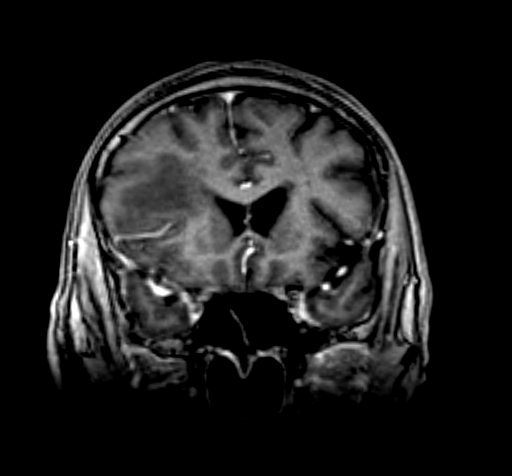

[45 of 48 positions shown; findings below may reference images not displayed]

FINDINGS: Brain: Age-related cerebral atrophy. Patchy and confluent T2/FLAIR
hyperintensity involving the periventricular and deep white matter
both cerebral hemispheres, most consistent with chronic
microvascular ischemic disease, moderate in nature. Few scattered
superimposed remote lacunar infarcts present about the bilateral
basal ganglia and hemispheric cerebral white matter.

Heterogeneous enhancing cortically based mass involving the right
frontal operculum measures 3.2 x 2.8 x 3.3 cm. Lesion demonstrates
heterogeneous enhancement. Associated susceptibility artifact
consistent with hemorrhage. Given the patient history of primary
lung carcinoma, appearance is most concerning for a solitary
hemorrhagic intracranial metastasis. Extensive vasogenic edema
throughout the adjacent right cerebral hemisphere. Partial
effacement of the right lateral ventricle with 5 mm of right-to-left
shift. No hydrocephalus or trapping. No other visible mass lesion or
abnormal enhancement.

No evidence for acute or subacute infarct elsewhere within the
brain. No extra-axial collection. Pituitary gland suprasellar region
normal.

Vascular: Major intracranial vascular flow voids are maintained.

Skull and upper cervical spine: Craniocervical junction within
normal limits. Visualized bone marrow signal intensity grossly
within normal limits. No focal marrow replacing lesion. No scalp
soft tissue abnormality.

Sinuses/Orbits: Globes and orbital soft tissues demonstrate no acute
finding. Paranasal sinuses are largely clear. No significant mastoid
effusion.

Other: None.
IMPRESSION: 1. 3.2 x 2.8 x 3.3 cm hemorrhagic mass involving the right frontal
operculum with associated vasogenic edema and regional mass effect
as above. Finding most concerning for a solitary hemorrhagic
metastasis. Extensive vasogenic edema throughout the adjacent right
cerebral hemisphere with associated 5 mm of right-to-left shift.
2. No other acute intracranial abnormality.
3. Age-related cerebral atrophy with underlying moderate chronic
microvascular ischemic disease and multiple chronic lacunar infarcts
about the bilateral basal ganglia and hemispheric cerebral white
matter.
